# Patient Record
Sex: Female | Born: 1965 | Race: White | Hispanic: No | Marital: Married | Smoking: Never smoker
Health system: Southern US, Community
[De-identification: ages and names within clinical notes are randomized; demographics above are authoritative.]

## PROBLEM LIST (undated history)

## (undated) DIAGNOSIS — T753XXA Motion sickness, initial encounter: Secondary | ICD-10-CM

## (undated) DIAGNOSIS — O09299 Supervision of pregnancy with other poor reproductive or obstetric history, unspecified trimester: Secondary | ICD-10-CM

## (undated) DIAGNOSIS — F419 Anxiety disorder, unspecified: Secondary | ICD-10-CM

## (undated) DIAGNOSIS — E7212 Methylenetetrahydrofolate reductase deficiency: Secondary | ICD-10-CM

## (undated) DIAGNOSIS — I10 Essential (primary) hypertension: Secondary | ICD-10-CM

## (undated) DIAGNOSIS — Z9889 Other specified postprocedural states: Secondary | ICD-10-CM

## (undated) DIAGNOSIS — R112 Nausea with vomiting, unspecified: Secondary | ICD-10-CM

## (undated) DIAGNOSIS — K219 Gastro-esophageal reflux disease without esophagitis: Secondary | ICD-10-CM

## (undated) DIAGNOSIS — Z98891 History of uterine scar from previous surgery: Secondary | ICD-10-CM

## (undated) DIAGNOSIS — Z8489 Family history of other specified conditions: Secondary | ICD-10-CM

## (undated) HISTORY — DX: Supervision of pregnancy with other poor reproductive or obstetric history, unspecified trimester: O09.299

## (undated) HISTORY — PX: DIAGNOSTIC LAPAROSCOPY: SUR761

## (undated) HISTORY — PX: CHOLECYSTECTOMY: SHX55

## (undated) HISTORY — DX: Essential (primary) hypertension: I10

---

## 1998-10-02 HISTORY — PX: KNEE ARTHROSCOPY W/ PARTIAL MEDIAL MENISCECTOMY: SHX1882

## 1998-10-02 HISTORY — PX: KNEE ARTHROSCOPY W/ ACL RECONSTRUCTION AND PATELLA GRAFT: SHX1861

## 2009-10-02 DIAGNOSIS — F419 Anxiety disorder, unspecified: Secondary | ICD-10-CM

## 2009-10-02 HISTORY — DX: Anxiety disorder, unspecified: F41.9

## 2009-10-03 ENCOUNTER — Inpatient Hospital Stay (HOSPITAL_COMMUNITY): Admission: AD | Admit: 2009-10-03 | Discharge: 2009-10-03 | Payer: Self-pay | Admitting: Obstetrics and Gynecology

## 2009-10-03 ENCOUNTER — Ambulatory Visit: Payer: Self-pay | Admitting: Obstetrics and Gynecology

## 2010-01-06 ENCOUNTER — Inpatient Hospital Stay (HOSPITAL_COMMUNITY): Admission: AD | Admit: 2010-01-06 | Discharge: 2010-01-15 | Payer: Self-pay | Admitting: Obstetrics

## 2010-01-11 ENCOUNTER — Encounter (INDEPENDENT_AMBULATORY_CARE_PROVIDER_SITE_OTHER): Payer: Self-pay | Admitting: Obstetrics

## 2010-10-23 ENCOUNTER — Encounter: Payer: Self-pay | Admitting: Obstetrics

## 2010-12-21 LAB — COMPREHENSIVE METABOLIC PANEL
ALT: 42 U/L — ABNORMAL HIGH (ref 0–35)
ALT: 44 U/L — ABNORMAL HIGH (ref 0–35)
ALT: 65 U/L — ABNORMAL HIGH (ref 0–35)
AST: 42 U/L — ABNORMAL HIGH (ref 0–37)
AST: 69 U/L — ABNORMAL HIGH (ref 0–37)
Albumin: 2.7 g/dL — ABNORMAL LOW (ref 3.5–5.2)
Albumin: 2.8 g/dL — ABNORMAL LOW (ref 3.5–5.2)
Albumin: 3 g/dL — ABNORMAL LOW (ref 3.5–5.2)
Alkaline Phosphatase: 51 U/L (ref 39–117)
Alkaline Phosphatase: 54 U/L (ref 39–117)
Alkaline Phosphatase: 54 U/L (ref 39–117)
Alkaline Phosphatase: 59 U/L (ref 39–117)
BUN: 11 mg/dL (ref 6–23)
BUN: 11 mg/dL (ref 6–23)
BUN: 9 mg/dL (ref 6–23)
CO2: 28 mEq/L (ref 19–32)
CO2: 28 mEq/L (ref 19–32)
Calcium: 8 mg/dL — ABNORMAL LOW (ref 8.4–10.5)
Calcium: 8.1 mg/dL — ABNORMAL LOW (ref 8.4–10.5)
Calcium: 8.2 mg/dL — ABNORMAL LOW (ref 8.4–10.5)
Calcium: 9 mg/dL (ref 8.4–10.5)
Creatinine, Ser: 0.63 mg/dL (ref 0.4–1.2)
Creatinine, Ser: 0.68 mg/dL (ref 0.4–1.2)
Creatinine, Ser: 0.71 mg/dL (ref 0.4–1.2)
GFR calc Af Amer: >60 mL/min (ref >60)
GFR calc non Af Amer: >60 mL/min (ref >60)
Glucose, Bld: 109 mg/dL — ABNORMAL HIGH (ref 70–99)
Glucose, Bld: 72 mg/dL (ref 70–99)
Glucose, Bld: 73 mg/dL (ref 70–99)
Glucose, Bld: 89 mg/dL (ref 70–99)
Potassium: 4.4 mEq/L (ref 3.5–5.1)
Potassium: 4.5 mEq/L (ref 3.5–5.1)
Potassium: 4.6 mEq/L (ref 3.5–5.1)
Sodium: 133 mEq/L — ABNORMAL LOW (ref 135–145)
Sodium: 135 mEq/L (ref 135–145)
Sodium: 136 mEq/L (ref 135–145)
Total Bilirubin: 0.2 mg/dL — ABNORMAL LOW (ref 0.3–1.2)
Total Bilirubin: 0.2 mg/dL — ABNORMAL LOW (ref 0.3–1.2)
Total Bilirubin: 0.4 mg/dL (ref 0.3–1.2)
Total Protein: 5.2 g/dL — ABNORMAL LOW (ref 6.0–8.3)
Total Protein: 5.4 g/dL — ABNORMAL LOW (ref 6.0–8.3)
Total Protein: 5.5 g/dL — ABNORMAL LOW (ref 6.0–8.3)
Total Protein: 5.7 g/dL — ABNORMAL LOW (ref 6.0–8.3)
Total Protein: 5.8 g/dL — ABNORMAL LOW (ref 6.0–8.3)

## 2010-12-21 LAB — RPR: RPR Ser Ql: NONREACTIVE

## 2010-12-21 LAB — URIC ACID
Uric Acid, Serum: 4 mg/dL (ref 2.4–7.0)
Uric Acid, Serum: 5 mg/dL (ref 2.4–7.0)
Uric Acid, Serum: 5 mg/dL (ref 2.4–7.0)
Uric Acid, Serum: 5.8 mg/dL (ref 2.4–7.0)

## 2010-12-21 LAB — CREATININE CLEARANCE, URINE, 24 HOUR
Collection Interval-CRCL: 24 hours
Creatinine Clearance: 156 mL/min — ABNORMAL HIGH (ref 75–115)
Creatinine, Urine: 63 mg/dL

## 2010-12-21 LAB — CBC
HCT: 29.6 % — ABNORMAL LOW (ref 36.0–46.0)
HCT: 33.2 % — ABNORMAL LOW (ref 36.0–46.0)
HCT: 33.6 % — ABNORMAL LOW (ref 36.0–46.0)
Hemoglobin: 10.2 g/dL — ABNORMAL LOW (ref 12.0–15.0)
Hemoglobin: 11.4 g/dL — ABNORMAL LOW (ref 12.0–15.0)
Hemoglobin: 11.5 g/dL — ABNORMAL LOW (ref 12.0–15.0)
MCHC: 32.9 g/dL (ref 30.0–36.0)
MCHC: 34.2 g/dL (ref 30.0–36.0)
MCHC: 34.4 g/dL (ref 30.0–36.0)
MCHC: 34.5 g/dL (ref 30.0–36.0)
MCV: 90.6 fL (ref 78.0–100.0)
MCV: 90.7 fL (ref 78.0–100.0)
MCV: 91.5 fL (ref 78.0–100.0)
Platelets: 240 10*3/uL (ref 150–400)
Platelets: 244 10*3/uL (ref 150–400)
RBC: 3.23 MIL/uL — ABNORMAL LOW (ref 3.87–5.11)
RBC: 3.68 MIL/uL — ABNORMAL LOW (ref 3.87–5.11)
RDW: 13.4 % (ref 11.5–15.5)
RDW: 13.6 % (ref 11.5–15.5)
RDW: 13.6 % (ref 11.5–15.5)
WBC: 11.6 10*3/uL — ABNORMAL HIGH (ref 4.0–10.5)

## 2010-12-21 LAB — PROTEIN, URINE, 24 HOUR
Collection Interval-UPROT: 24 hours
Protein, Urine: 77 mg/dL

## 2010-12-21 LAB — LACTATE DEHYDROGENASE: LDH: 128 U/L (ref 94–250)

## 2010-12-22 ENCOUNTER — Ambulatory Visit (HOSPITAL_COMMUNITY)
Admission: RE | Admit: 2010-12-22 | Discharge: 2010-12-22 | Disposition: A | Discharge status: Home / Self Care | Payer: BC Managed Care – PPO | Source: Ambulatory Visit | Attending: Obstetrics | Admitting: Obstetrics

## 2010-12-22 DIAGNOSIS — Z139 Encounter for screening, unspecified: Secondary | ICD-10-CM | POA: Insufficient documentation

## 2011-01-20 NOTE — Op Note (Signed)
Jill Cruz, Jill Cruz           ACCOUNT NO.:  [id number]  MEDICAL RECORD NO.:  [id number]          PATIENT TYPE:  OUT  LOCATION:  MFM                           FACILITY:  [facility]  PHYSICIAN:  Lendon Colonel, MD   DATE OF BIRTH:  1965/11/09  DATE OF PROCEDURE:  01/11/2010 DATE OF DISCHARGE:                              OPERATIVE REPORT   PREOPERATIVE DIAGNOSES:  Severe preeclampsia, fetal distress.  POSTOPERATIVE DIAGNOSES:  Severe preeclampsia, fetal distress.  PROCEDURE:  Classical cesarean section.  SURGEON:  Alphonsus Sias. Ernestina Penna, MD then taken over by Lenoard Aden, MD  ASSISTANT:  Catalina Antigua, MD  ANESTHESIA:  Spinal.  FINDINGS:  A 640-g infant, gender undetermined at the time of dictation. Baby with cry, movement, and heart beat.  Apgars 4 and 7.  Cord pH 7.2. A small placenta with meconium-stained amniotic fluid.  No blood-tinge to the amniotic fluid.  Normal uterus, tubes, and ovaries.  SPECIMENS:  Placenta to Pathology.  ANTIBIOTICS:  Ancef 2 g.  ESTIMATED BLOOD LOSS:  500 mL.  COMPLICATIONS:  None.  INDICATIONS:  This is a 45 year old G1 at 26 weeks and 2 days who had been in the hospital for diagnosis of preeclampsia with oligohydramnios. The patient was receiving continuous monitoring for absence of end- diastolic flow in a baby at the 25th percentile, also known to have a preplacental clot.  The patient was status post betamethasone and 24 hours of magnesium approximately 4 days prior.  Chain of events, review of NST, and review of the events is in a handwritten note in the chart. In short, reactive testing was noted until about 1:40 p.m. today where 4 deep variables occurred over a 20-minute period, each about 1-2 minutes with recovery to baseline and 5-8 beat variability with a strip that was appropriate for gestational age.  Again at 2:30 p.m., there were 4 quick variable decelerations and then the baby was off the monitor for approximately 1-2  minutes.  The RN was in immediately to assess and get the baby back on the monitor.  After about 4-5 minutes of the nurse unable to get the baby on the monitor, I was present with an ultrasound and on ultrasound, there was no fetal heart beat for approximately 4 minutes.  There was no movement, there was no appreciable amniotic fluid, and after about 4 minutes, spontaneous resolution of the heart beat to about 100.  NST was attached.  About 4 minutes of the heart beat in the 100s was noted, and a decision was made for an urgent C-section. The remainder of the chain of events is in the chart.  DESCRIPTION OF PROCEDURE:  After a quick informed consent was obtained, the patient was taken to the operating room where a fetal heart beat in the low 100s was again noted.  A spinal anesthesia was quickly given.  Uterine incision was at 1501.  A low transverse Pfannenstiel skin incision made with a scalpel, carried through to the underlying layer of fascia with a scalpel.  The fascia was nicked in the midline.  The incision was extended laterally with the Mayo scissors.  Inferior aspect of the  fascial incision was grasped with Kocher clamps, elevated up.  The underlying rectus muscles were dissected off with the Mayo scissors. Inferior aspect of the fascial incision was grasped with Kocher clamps, elevated up, and the rectus muscles were dissected off with the Mayo scissors.  The rectus muscles were bluntly divided in the midline.  The peritoneum was identified.  Initial attempts to enter bluntly were unsuccessful.  Pickups x2 were placed on the peritoneal incision, entered sharply with the Mayo scissors.  The incision was extended laterally bluntly and extended inferiorly with the Mayo scissors.  A bladder blade was inserted.  The bladder blade was used to retract the bladder.  Two sponges were placed into the abdomen to retract the omentum that was coming over a very small uterus.  The uterus  was assessed.  A vertical incision was made in the uterus with the scalpel. Given the depth of the incision, Allis clamps were placed by both edges of the uterine incision until the amniotic fluid was entered.  The amniotic sac was ruptured with the knife.  The incision in the amniotic sac was extended.  Meconium-stained fluid was noted.  The infant's occiput was gently grasped and with pressure on the fundus, the infant was delivered.  Cord was clamped and cut.  Cry and movement were noted, and the infant was handed off to the waiting pediatrician.  The placenta was very small and easily fell out of the uterus with minimal traction. No large retroplacental clot was noted.  All placental tissue was removed and sent to the pathologist.  The uterus was exteriorized. Allis clamps was placed along the uterine incision to control hemostasis and a 0 Vicryl was used in a running locked fashion starting at the apex of the incision going down to the bottom of the incision.  At this point, I had to leave the operating room for another operative case.  At this point, Dr. Billy Coast took over the remainder of the case with Dr. Jolayne Panther as the assistant.  A second layer of 0 Monocryl was used to put an imbricating layer over the uterine myometrium.  Good hemostasis was obtained.  The uterus was returned to the abdomen and gutters were cleared of all clots and debris.  The sponges were removed.  The peritoneum was closed with a 2-0 Vicryl in a running fashion and the cut muscle edges and underside of the fascia were inspected, found to be hemostatic, and the fascia was closed with 0 Vicryl in a running fashion.  The subcutaneous tissue was irrigated.  A 2-0 plain gut was used in the subcu space, and the skin was closed with staples.  The patient tolerated the procedure well.  Sponge, lap, and needle counts were correct x3.  The patient was taken to the recovery room in stable condition.     Lendon Colonel, MD     KAF/MEDQ  D:  01/11/2010  T:  01/12/2010  Job:  [id number]  Electronically Signed by Noland Fordyce MD on 01/20/2011 07:01:51 AM

## 2011-01-30 ENCOUNTER — Other Ambulatory Visit: Payer: Self-pay | Admitting: Obstetrics

## 2011-02-20 ENCOUNTER — Other Ambulatory Visit (HOSPITAL_COMMUNITY): Payer: Self-pay | Admitting: Obstetrics

## 2011-02-20 DIAGNOSIS — Z1231 Encounter for screening mammogram for malignant neoplasm of breast: Secondary | ICD-10-CM

## 2011-03-02 ENCOUNTER — Ambulatory Visit (HOSPITAL_COMMUNITY)
Admission: RE | Admit: 2011-03-02 | Discharge: 2011-03-02 | Disposition: A | Discharge status: Home / Self Care | Payer: BC Managed Care – PPO | Source: Ambulatory Visit | Attending: Obstetrics | Admitting: Obstetrics

## 2011-03-02 DIAGNOSIS — Z1231 Encounter for screening mammogram for malignant neoplasm of breast: Secondary | ICD-10-CM | POA: Insufficient documentation

## 2012-02-13 ENCOUNTER — Other Ambulatory Visit (HOSPITAL_COMMUNITY): Payer: Self-pay | Admitting: Obstetrics

## 2012-02-13 DIAGNOSIS — Z1231 Encounter for screening mammogram for malignant neoplasm of breast: Secondary | ICD-10-CM

## 2012-03-11 ENCOUNTER — Ambulatory Visit (HOSPITAL_COMMUNITY): Payer: BC Managed Care – PPO

## 2012-04-08 ENCOUNTER — Ambulatory Visit (HOSPITAL_COMMUNITY)
Admission: RE | Admit: 2012-04-08 | Discharge: 2012-04-08 | Disposition: A | Discharge status: Home / Self Care | Payer: BC Managed Care – PPO | Source: Ambulatory Visit | Attending: Obstetrics | Admitting: Obstetrics

## 2012-04-08 DIAGNOSIS — Z1231 Encounter for screening mammogram for malignant neoplasm of breast: Secondary | ICD-10-CM

## 2012-04-17 ENCOUNTER — Other Ambulatory Visit: Payer: Self-pay

## 2012-04-26 ENCOUNTER — Encounter (HOSPITAL_COMMUNITY): Payer: BC Managed Care – PPO

## 2012-05-01 ENCOUNTER — Encounter (HOSPITAL_COMMUNITY): Payer: Self-pay

## 2012-05-01 ENCOUNTER — Ambulatory Visit (HOSPITAL_COMMUNITY)
Admission: RE | Admit: 2012-05-01 | Discharge: 2012-05-01 | Disposition: A | Discharge status: Home / Self Care | Payer: BC Managed Care – PPO | Source: Ambulatory Visit | Attending: Obstetrics | Admitting: Obstetrics

## 2012-05-01 DIAGNOSIS — D688 Other specified coagulation defects: Secondary | ICD-10-CM

## 2012-05-01 NOTE — Consult Note (Signed)
Maternal Fetal Medicine Consultation  Requesting Provider(s): Noland Fordyce, MD  Reason for consultation: Preconception counseling/ High risk OB  HPI: Jill Cruz is a 46 yo G1P0101 who is seen today for preconception counseling.  She plans to undergo IVF with donor embryos (donor was 46 years old at time of retrieval).  The patient's past OB history is remarkable for a history of severe preeclampsia s/p Classical C-section at 26 weeks for fetal distress.  The patient was admitted for severe preeclampsia and was noted to have absent end-diastolic flow on umbilical artery Doppler studies.  While on continuous fetal monitoring, she developed severe variable decelerations and a period of 4 minutes without fetal heart tones - underwent an urgent cesarean delivery.  Her child had hypospadias that required surgical intervention - with female karyotype,  Her child is currently doing well without any long-term problems. Placental pathology was within normal limits without evidence of clots/ infarcts. 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  She is now seen for recommendations for anticipated pregnancy management and clearance for ART.  OB History: OB History    Grav Para Term Preterm Abortions TAB SAB Ect Mult Living   1 1 0 1 0 0 0 0 0 1       PMH: Infertility, Chronic hypertension - previously on Lisinopril (recently discontinued and started on Labetolol)  PSH: Bilateral salpingotomy 2006, Laparoscopy 2003, 2006, Cholecystectomy (2003), Knee arthroscopy (2000), Wisdom teeth extraction, Classical C-section, IVF (8 cycles over last 5 years)  Meds: Folic acid 4 mg daily, Labetolol, Paxil (currently weaning off)  Allergies: Codeine (nausea/ vomiting)  FH: Hypertension - Maternal grandmother, Maternal grandfather, Breast Cancer - Maternal grandmother, Maternal great aunt, Hodgkin's disease - aunt.    Soc: denies  ETOH/ Tobacco/ Illicit drug use   A/P: 1) MTFHR homozygote - Although hyperhomocysteinemia has was previously reported to be a modest risk factor for thromboembolism, recent data indicates that elevated levels is a weak risk factor.  Intervention studies with B vitamin supplementation showed no reduction in risk for VTE.  Currently, there is insufficient evidence to recommend screening for MTHFR polymorphisms or fasting homocystine levels for venous thromboembolism (ACOG practice bulletin # 124).  Additionally, most large, prospective studies have failed to establish a consistent link between adverse pregnancy outcomes (preeclampsia, fetal loss, abruption) and inherited thrombophilia including hyperhomocystenemia/ MTFHR polymorphisms.  Based on this information, would not recommend Lovinox prophylaxis beyond the first trimester of pregnancy, but feel that continued supplementation with the current dose of folic acid is safe and reasonable.  2) Hx of severe preeeclampsia - Given the patients hx of second trimester severe preeclampsia, she is at high risk for recurrence - risks as high as 25-65% have been reported.  Given this history, would recommend baby ASA supplementation which may be of some benefit in this high risk population.  Would recommend baseline 24-hr urine protein, preeclampsia labs in the first trimester, serial growth scans and antepartum fetal testing no later that [redacted] weeks gestation.  Would recommend repeat C-section at 36-37 weeks if undelivered due to history of prior classical C-section.  3) Hx of previous child with "ambiguous genitalia" / hypospadias - see genetic counselor's note.    We reviewed risks/ likelihood of preterm delivery and recurrence of severe preeclampsia.  The patient is aware of these potential risks and would like to proceed with ART.  Questions were answered to the patient's satisfaction.   Thank you  for the opportunity to be a part of the care of Jill Cruz. Please contact our office if we can be of further assistance.   I spent approximately 30 minutes with this patient with over 50% of time spent in face-to-face counseling.  Alpha Gula, MD

## 2012-05-01 NOTE — Progress Notes (Signed)
Genetic Counseling  High-Risk Gestation Note  Appointment Date:  05/01/2012 Referred By: Lendon Colonel., MD Date of Birth:  12/01/1965  Pregnancy History: G1P0101 Estimated Date of Delivery: Not pregnant Estimated Gestational Age: Not pregnant Attending: Alpha Gula, MD   I met with Mrs. Jill Cruz for preconception genetic counseling regarding her history of preterm delivery of a son with with ambiguous genitalia.    Medical records were reviewed and found to be contributory for Mrs. Jill Cruz delivering at [redacted] weeks gestation by emergency c-section for loss of fetal heart tones.  The baby was noted at birth to be small for gestational age and had ambiguous genitalia.  Prenatal ultrasounds suggested female genitalia; however, at birth the child was noted to have a small phallus with a grade 2 hypospadius.  There was a shawl scrotum.  Chromosome analysis was ordered and revealed a 46,XY karyotype.  There was at least one typed report in Mrs. Jill Cruz's medical records that stated that the child had 66,XX (female) chromosomes, and that the parents had chosen to raise the child as a boy.  This is not the case.  A copy of this child's karyotype was obtained and it did reveal female chromosomes, not female.  Partial androgen insensitivity and 5-alpha-reductase deficiency were ruled out during the child's stay in the NICU.  Mrs. Jill Cruz reported that her son saw endocrinology at [facility] for a short period of time, but was released from care during the first few months of life.  This child is currently doing very well.  He is 46 years old and has had no major health concerns since being released from the NICU.  His only surgery was for genital repair.  He is developmentally normal, even considering his prematurity.  He has never been evaluated by a medical geneticist.  A specific cause for the ambiguous genitalia has not been discovered.    Mrs. Jill Cruz was counseled that there are many  causes for ambiguous genitalia, including environmental (in utero exposure to teratogens), multifactorial, and genetic etiologies.  We reviewed chromosomes, genes, single gene conditions, and recessive, dominant, and X-linked inheritance patterns.  Mrs. Jill Cruz reported that her son was conceived with a donor egg following multiple failed IVF cycles.  She has three frozen embryos from the egg donor she used with her son.  She understands that if her son has a genetic cause for the ambiguous genitalia, the risk of recurrence could be as high as 25%.  We discussed the availability of evaluation by a medical geneticist to determine if her son has a specific genetic condition and to help define recurrence risks.  If a future pregnancy is conceived with a different donor or Mrs. Jill Cruz own egg, the risk of recurrence would be expected to be lower.  However, we reviewed that if Mrs. Jill Cruz conceives with her own egg, there would be risks related to advanced maternal age.  She is aware of those risks and available screening and diagnostic options.  Mrs. Jill Cruz will contact your office or ours if she is interested in having her son evaluated by a medical geneticist.  Alternatively, her son's pediatrician can request an evaluation.  Regarding management of a future pregnancy, Mrs. Jill Cruz was counseled that prenatal screening and diagnostic options may vary, depending on the underlying cause of the ambiguous genitalia.  She understands that detailed ultrasound is available in a future pregnancy, but cannot detect all anatomic differences.   Both family histories were reviewed and found to be  otherwise noncontributory for birth defects, mental retardation, and known genetic conditions. Without further information regarding the provided family history, an accurate genetic risk cannot be calculated. Further genetic counseling is warranted if more information is obtained.  Mrs. Jill Cruz had consultation with Dr. Alpha Gula to review her medical history and associated risks/options.  That consult note will be documented separately.  I counseled Mrs. Jill Cruz regarding the above risks and available options.  The approximate face-to-face time with the genetic counselor was 42 minutes.  Donald Prose, MS Certified Genetic Counselor

## 2012-05-01 NOTE — ED Notes (Signed)
BP-133/86, P-57, Wt-231lb

## 2012-05-07 ENCOUNTER — Encounter (HOSPITAL_COMMUNITY): Payer: BC Managed Care – PPO

## 2012-10-10 LAB — OB RESULTS CONSOLE RPR: RPR: NONREACTIVE

## 2012-10-10 LAB — OB RESULTS CONSOLE HEPATITIS B SURFACE ANTIGEN: Hepatitis B Surface Ag: NEGATIVE

## 2012-10-10 LAB — OB RESULTS CONSOLE ANTIBODY SCREEN: Antibody Screen: NEGATIVE

## 2012-11-06 ENCOUNTER — Encounter: Payer: Self-pay | Admitting: Family Medicine

## 2012-11-06 ENCOUNTER — Ambulatory Visit (INDEPENDENT_AMBULATORY_CARE_PROVIDER_SITE_OTHER): Payer: BC Managed Care – PPO | Admitting: Family Medicine

## 2012-11-06 VITALS — BP 110/80 | HR 76 | Temp 98.0°F | Ht 67.0 in | Wt 248.0 lb

## 2012-11-06 DIAGNOSIS — O09299 Supervision of pregnancy with other poor reproductive or obstetric history, unspecified trimester: Secondary | ICD-10-CM

## 2012-11-06 DIAGNOSIS — I10 Essential (primary) hypertension: Secondary | ICD-10-CM | POA: Insufficient documentation

## 2012-11-06 NOTE — Progress Notes (Signed)
Subjective:    Patient ID: Jill Cruz, female    DOB: [birth year], 47 y.o.   MRN: [id number]  HPI  Very pleasant 47 yo female who is [redacted] weeks pregnant with her second son here to establish care.  Sees Dr. Ernestina Penna and sees her every other week due to her high risk pregnancy due to [facility] and previous high risk pregnancy. Had severe pre eclampsia with first pregnancy- son delivered via emergency C Section at 26 weeks.  On Labetalol 200 mg three times daily.  Having no noticeable side effects and BP has been well controlled.  Morning sickness has resolved.  In good spirits and having no complaints today.  Recently moved from [place] and needs a PCP.  Brings in recent lab work from a work screening- all labs excellent (see scanned report).  Patient Active Problem List  Diagnosis  . Hypertension  . History of pre-eclampsia in prior pregnancy, currently pregnant   Past Medical History  Diagnosis Date  . Hypertension   . History of pre-eclampsia in prior pregnancy, currently pregnant    Past Surgical History  Procedure Date  . Cholecystectomy   . Cesarean section    History  Substance Use Topics  . Smoking status: Never Smoker   . Smokeless tobacco: Not on file  . Alcohol Use: Not on file   Family History  Problem Relation Age of Onset  . Hypertension Mother   . Diabetes Mother   . Arthritis Mother   . Arthritis Father    Allergies  Allergen Reactions  . Codeine    Current Outpatient Prescriptions on File Prior to Visit  Medication Sig Dispense Refill  . labetalol (NORMODYNE) 200 MG tablet Take 200 mg by mouth 3 (three) times daily.       The PMH, PSH, Social History, Family History, Medications, and allergies have been reviewed in [facility], and have been updated if relevant.   Review of Systems See HPI Patient reports no  vision/ hearing changes,anorexia, weight change, fever ,adenopathy, persistant / recurrent hoarseness, swallowing issues, chest pain,  edema,persistant / recurrent cough, hemoptysis, dyspnea(rest, exertional, paroxysmal nocturnal), gastrointestinal  bleeding (melena, rectal bleeding), abdominal pain, excessive heart burn, GU symptoms(dysuria, hematuria, pyuria, voiding/incontinence  Issues) syncope, focal weakness, severe memory loss, concerning skin lesions, depression, anxiety, abnormal bruising/bleeding, major joint swelling, breast masses or abnormal vaginal bleeding.       Objective:   Physical Exam BP 110/80  Pulse 76  Temp 98 F (36.7 C)  Ht 5\' 7"  (1.702 m)  Wt 248 lb (112.492 kg)  BMI 38.84 kg/m2  LMP 02/22/2012  Gen: pleaseant, well-nourished,in no acute distress; alert,appropriate and cooperative throughout examination Head:  normocephalic and atraumatic.   Eyes:  vision grossly intact, pupils equal, pupils round, and pupils reactive to light.   Ears:  R ear normal and L ear normal.   Nose:  no external deformity.   Mouth:  good dentition.   Lungs:  Normal respiratory effort, chest expands symmetrically. Lungs are clear to auscultation, no crackles or wheezes. Heart:  Normal rate and regular rhythm. S1 and S2 normal without gallop, murmur, click, rub or other extra sounds. Abdomen:  Bowel sounds positive,abdomen soft and non-tender without masses, organomegaly or hernias noted. Msk:  No deformity or scoliosis noted of thoracic or lumbar spine.   Extremities:  No clubbing, cyanosis, edema, or deformity noted with normal full range of motion of all joints.   Neurologic:  alert & oriented X3 and gait normal.   Skin:  Intact without suspicious lesions or rashes Psych:  Cognition and judgment appear intact. Alert and cooperative with normal attention span and concentration. No apparent delusions, illusions, hallucinations     Assessment & Plan:   1. Hypertension  Well controlled on Labetolol.  2. History of pre-eclampsia in prior pregnancy, currently pregnant  See above.  Followed by Dr. Ernestina Penna.

## 2012-11-06 NOTE — Patient Instructions (Addendum)
It was wonderful to meet you. Congratulations on your pregnancy!

## 2013-03-04 ENCOUNTER — Other Ambulatory Visit: Payer: Self-pay | Admitting: Obstetrics

## 2013-03-04 DIAGNOSIS — O09819 Supervision of pregnancy resulting from assisted reproductive technology, unspecified trimester: Secondary | ICD-10-CM | POA: Insufficient documentation

## 2013-03-04 DIAGNOSIS — O352XX0 Maternal care for (suspected) hereditary disease in fetus, not applicable or unspecified: Secondary | ICD-10-CM | POA: Insufficient documentation

## 2013-03-17 ENCOUNTER — Encounter (HOSPITAL_COMMUNITY): Payer: Self-pay | Admitting: Pharmacist

## 2013-03-24 ENCOUNTER — Encounter (HOSPITAL_COMMUNITY): Payer: Self-pay

## 2013-03-25 ENCOUNTER — Encounter (HOSPITAL_COMMUNITY)
Admission: RE | Admit: 2013-03-25 | Discharge: 2013-03-25 | Disposition: A | Discharge status: Home / Self Care | Payer: BC Managed Care – PPO | Source: Ambulatory Visit | Attending: Obstetrics | Admitting: Obstetrics

## 2013-03-25 ENCOUNTER — Encounter (HOSPITAL_COMMUNITY): Payer: Self-pay

## 2013-03-25 HISTORY — DX: Other specified postprocedural states: R11.2

## 2013-03-25 HISTORY — DX: Other specified postprocedural states: Z98.890

## 2013-03-25 HISTORY — DX: Anxiety disorder, unspecified: F41.9

## 2013-03-25 HISTORY — DX: Gastro-esophageal reflux disease without esophagitis: K21.9

## 2013-03-25 LAB — CBC
HCT: 32.4 % — ABNORMAL LOW (ref 36.0–46.0)
Hemoglobin: 11.2 g/dL — ABNORMAL LOW (ref 12.0–15.0)
MCV: 81.2 fL (ref 78.0–100.0)
RBC: 3.99 MIL/uL (ref 3.87–5.11)
WBC: 8.2 10*3/uL (ref 4.0–10.5)

## 2013-03-25 LAB — ABO/RH: ABO/RH(D): O POS

## 2013-03-25 LAB — TYPE AND SCREEN
ABO/RH(D): O POS
Antibody Screen: NEGATIVE

## 2013-03-25 NOTE — Patient Instructions (Addendum)
   Your procedure is scheduled on: Thursday 03/27/13 at 9:30 am Enter through the Main Entrance of [facility] at: 8 am  Pick up the phone at the desk and dial [phone] and inform us of your arrival.  Please call this number if you have any problems the morning of surgery: [phone]  Remember: Do not eat any solid foods or drink any liquids after midnight on: Thursday 03/27/13  Please take these medications morning of surgery (with sips of water): Labetalol, and TUMS   Do not wear jewelry, make-up, or FINGER nail polish No metal in your hair or on your body. Do not wear lotions, powders, perfumes. You may wear deodorant.  Please use your CHG wash as directed prior to surgery.  Do not shave anywhere for at least 12 hours prior to first CHG shower.  Do not bring valuables to the hospital. Contacts, Dentures and Partial Plates may not be worn to OR  Leave suitcase in the car. After Surgery it may be brought to your room.  For patients being admitted to the hospital, checkout time is 11:00am the day of discharge.

## 2013-03-26 NOTE — H&P (Signed)
Jill Cruz is a 47 y.o. G2P0101 at [redacted]w[redacted]d presenting for RCS due to h/o prior classical c/s. Pt notes rare contractions. Good fetal movement, No vaginal bleeding, not leaking fluid. No HA, no vision change, no RUQ pain. Pt w/ prior classical c/s at 25 wks and recommendations from MFM for RCS between 36 amd 37 wks.   [facility]are at [facility] since 7 wks - h/o severe PEC at 25 wks as G1 in 2011. Emergency classical c/s after fetal testing for reversed EDF revealed fetal cardiac asystole in presence of provider. Emergency surgery done, baby in NICU for several months - h/o baby with ambigous genitalia, s/p genetic counseling, most likely spontaneous, not genetic in etiology. Nl u/s this preg, nl fetal echo - h/o infertility, endometriosis, x-lap x 2, s/p b/l salpingectomy. Pregnant on 9th round of IVF, donor egg - MTHFR homozygous, used Lovenox until 12 wks, remainder of preg 4 mg folic acid and 81 mg ASA - AMA, unable to do NT screen, nl [facility], donor egg - chronic htn. bp's started elevating at 20 wks, only single dose increase of labetalol to 300mg  tid, several sets of PIH labs normal, never w/ sx of PEC    Prenatal Transfer Tool  Maternal Diabetes: No Genetic Screening: Normal Maternal Ultrasounds/Referrals: Normal Fetal Ultrasounds or other Referrals:  Fetal echo Maternal Substance Abuse:  No Significant Maternal Medications:  None Significant Maternal Lab Results: None     OB History   Grav Para Term Preterm Abortions TAB SAB Ect Mult Living   2 1 0 1 0 0 0 0 0 1      Past Medical History  Diagnosis Date  . History of pre-eclampsia in prior pregnancy, currently pregnant   . PONV (postoperative nausea and vomiting)   . Hypertension     On Labetalol  . Anxiety 2011    Post Partum Anxiety/Depression treated w/ medication. Now resolved.  Marland Kitchen GERD (gastroesophageal reflux disease)     Mild baseline- worse w/pregnancy. Takes TUMS prn.   Past Surgical History  Procedure  Laterality Date  . Cesarean section  2011    at [facility]  . Cholecystectomy  ~2003    [facility] in [place]  . Anterior cruciate ligament repair Right 2000    Reconstruction Surgery at [facility] in [place]  . Diagnostic laparoscopy Left ~2008    Ovarian Cyst removed- [facility]. in [place]  lscope x 2 for gyn reasons  Meds: folic acid, labetalol, PNV, ASA All: codeine sensitivity  Family History: family history includes Arthritis in her father and mother; Diabetes in her mother; and Hypertension in her mother. Social History:  reports that she has never smoked. She does not have any smokeless tobacco history on file. She reports that she does not drink alcohol or use illicit drugs.  Review of Systems - Negative except occasional HA, good FM  Filed Vitals:   03/27/13 0854  BP: 166/99  Pulse:   Temp:   Resp:       Last menstrual period 02/22/2012.  Physical Exam:  Gen: well appearing, no distress  Back: no CVAT Abd: gravid, NT, no RUQ pain LE: trace edema, equal bilaterally, non-tender  Prenatal labs: ABO, Rh: --/--/O POS, O POS (06/24 1015) Antibody: NEG (06/24 1015) Rubella:  immune RPR: NON REACTIVE (06/24 1015)  HBsAg: Negative (01/09 0000)  HIV: Non-reactive (01/09 0000)  GBS:   neg 1 hr Glucola 129  Genetic screening nl [facility] Anatomy US nl u/s, needed fetal echo due to  limited views LVOT- nl fetal echo   Assessment/Plan: 47 y.o. G2P0101 at [redacted]w[redacted]d RCS as planned. Pt aware R/B including those of prematurity at 36 wks.  Monitor for PP depression Resume labetalol PP, watch closely for PP PEC, cont baby ASA.    Stephon Weathers A. 03/26/2013, 11:02 PM

## 2013-03-27 ENCOUNTER — Encounter (HOSPITAL_COMMUNITY): Payer: Self-pay | Admitting: Anesthesiology

## 2013-03-27 ENCOUNTER — Encounter (HOSPITAL_COMMUNITY)
Admission: AD | Disposition: A | Discharge status: Home / Self Care | Payer: Self-pay | Source: Ambulatory Visit | Attending: Obstetrics

## 2013-03-27 ENCOUNTER — Inpatient Hospital Stay (HOSPITAL_COMMUNITY)
Admission: AD | Admit: 2013-03-27 | Discharge: 2013-03-30 | DRG: 370 | Disposition: A | Discharge status: Home / Self Care | Payer: BC Managed Care – PPO | Source: Ambulatory Visit | Attending: Obstetrics | Admitting: Obstetrics

## 2013-03-27 ENCOUNTER — Inpatient Hospital Stay (HOSPITAL_COMMUNITY): Payer: BC Managed Care – PPO | Admitting: Anesthesiology

## 2013-03-27 DIAGNOSIS — Z1589 Genetic susceptibility to other disease: Secondary | ICD-10-CM

## 2013-03-27 DIAGNOSIS — O1002 Pre-existing essential hypertension complicating childbirth: Secondary | ICD-10-CM | POA: Diagnosis present

## 2013-03-27 DIAGNOSIS — O34219 Maternal care for unspecified type scar from previous cesarean delivery: Principal | ICD-10-CM | POA: Diagnosis present

## 2013-03-27 DIAGNOSIS — Z98891 History of uterine scar from previous surgery: Secondary | ICD-10-CM

## 2013-03-27 DIAGNOSIS — O09529 Supervision of elderly multigravida, unspecified trimester: Secondary | ICD-10-CM | POA: Diagnosis present

## 2013-03-27 DIAGNOSIS — I10 Essential (primary) hypertension: Secondary | ICD-10-CM | POA: Diagnosis present

## 2013-03-27 DIAGNOSIS — O9903 Anemia complicating the puerperium: Secondary | ICD-10-CM | POA: Diagnosis not present

## 2013-03-27 DIAGNOSIS — E7212 Methylenetetrahydrofolate reductase deficiency: Secondary | ICD-10-CM | POA: Diagnosis present

## 2013-03-27 DIAGNOSIS — O09299 Supervision of pregnancy with other poor reproductive or obstetric history, unspecified trimester: Secondary | ICD-10-CM

## 2013-03-27 DIAGNOSIS — D62 Acute posthemorrhagic anemia: Secondary | ICD-10-CM | POA: Diagnosis not present

## 2013-03-27 HISTORY — DX: Methylenetetrahydrofolate reductase deficiency: E72.12

## 2013-03-27 HISTORY — DX: Genetic susceptibility to other disease: Z15.89

## 2013-03-27 HISTORY — DX: History of uterine scar from previous surgery: Z98.891

## 2013-03-27 LAB — CORD BLOOD GAS (ARTERIAL)
Acid-base deficit: 1.2 mmol/L (ref 0.0–2.0)
Bicarbonate: 26.1 mEq/L — ABNORMAL HIGH (ref 20.0–24.0)
TCO2: 27.9 mmol/L (ref 0–100)
pCO2 cord blood (arterial): 57.1 mmHg
pH cord blood (arterial): 7.283

## 2013-03-27 SURGERY — Surgical Case
Anesthesia: Spinal | Site: Abdomen | Wound class: Clean Contaminated

## 2013-03-27 MED ORDER — MORPHINE SULFATE (PF) 0.5 MG/ML IJ SOLN
INTRAMUSCULAR | Status: DC | PRN
  Administered 2013-03-27: .15 mg via INTRATHECAL

## 2013-03-27 MED ORDER — LACTATED RINGERS IV SOLN
INTRAVENOUS | Status: DC | PRN
  Administered 2013-03-27 (×3): via INTRAVENOUS

## 2013-03-27 MED ORDER — KETOROLAC TROMETHAMINE 30 MG/ML IJ SOLN
30.0000 mg | Freq: Four times a day (QID) | INTRAMUSCULAR | Status: DC | PRN
  Administered 2013-03-27: 30 mg via INTRAVENOUS

## 2013-03-27 MED ORDER — FOLIC ACID 400 MCG PO TABS
800.0000 ug | ORAL_TABLET | Freq: Every day | ORAL | Status: DC
  Filled 2013-03-27 (×2): qty 2

## 2013-03-27 MED ORDER — IBUPROFEN 600 MG PO TABS
600.0000 mg | ORAL_TABLET | Freq: Four times a day (QID) | ORAL | Status: DC
  Administered 2013-03-27 – 2013-03-30 (×11): 600 mg via ORAL
  Filled 2013-03-27 (×11): qty 1

## 2013-03-27 MED ORDER — OXYTOCIN 10 UNIT/ML IJ SOLN
INTRAMUSCULAR | Status: AC
  Filled 2013-03-27: qty 4

## 2013-03-27 MED ORDER — PHENYLEPHRINE 40 MCG/ML (10ML) SYRINGE FOR IV PUSH (FOR BLOOD PRESSURE SUPPORT)
PREFILLED_SYRINGE | INTRAVENOUS | Status: AC
  Filled 2013-03-27: qty 5

## 2013-03-27 MED ORDER — MEPERIDINE HCL 25 MG/ML IJ SOLN: 6.2500 mg | INTRAMUSCULAR | Status: DC | PRN

## 2013-03-27 MED ORDER — DIPHENHYDRAMINE HCL 50 MG/ML IJ SOLN: 25.0000 mg | INTRAMUSCULAR | Status: DC | PRN

## 2013-03-27 MED ORDER — DIPHENHYDRAMINE HCL 25 MG PO CAPS: 25.0000 mg | ORAL_CAPSULE | Freq: Four times a day (QID) | ORAL | Status: DC | PRN

## 2013-03-27 MED ORDER — ONDANSETRON HCL 4 MG/2ML IJ SOLN
INTRAMUSCULAR | Status: AC
  Filled 2013-03-27: qty 2

## 2013-03-27 MED ORDER — ACETAMINOPHEN 10 MG/ML IV SOLN
1000.0000 mg | Freq: Four times a day (QID) | INTRAVENOUS | Status: AC | PRN
  Filled 2013-03-27: qty 100

## 2013-03-27 MED ORDER — FENTANYL CITRATE 0.05 MG/ML IJ SOLN
INTRAMUSCULAR | Status: AC
  Filled 2013-03-27: qty 2

## 2013-03-27 MED ORDER — NALOXONE HCL 0.4 MG/ML IJ SOLN: 0.4000 mg | INTRAMUSCULAR | Status: DC | PRN

## 2013-03-27 MED ORDER — SENNOSIDES-DOCUSATE SODIUM 8.6-50 MG PO TABS
2.0000 | ORAL_TABLET | Freq: Every day | ORAL | Status: DC
  Administered 2013-03-27 – 2013-03-29 (×3): 2 via ORAL

## 2013-03-27 MED ORDER — MENTHOL 3 MG MT LOZG: 1.0000 | LOZENGE | OROMUCOSAL | Status: DC | PRN

## 2013-03-27 MED ORDER — FENTANYL CITRATE 0.05 MG/ML IJ SOLN
INTRAMUSCULAR | Status: DC | PRN
  Administered 2013-03-27: 25 ug via INTRATHECAL

## 2013-03-27 MED ORDER — ONDANSETRON HCL 4 MG/2ML IJ SOLN: 4.0000 mg | Freq: Three times a day (TID) | INTRAMUSCULAR | Status: DC | PRN

## 2013-03-27 MED ORDER — OXYCODONE-ACETAMINOPHEN 5-325 MG PO TABS: 1.0000 | ORAL_TABLET | ORAL | Status: DC | PRN

## 2013-03-27 MED ORDER — SCOPOLAMINE 1 MG/3DAYS TD PT72
1.0000 | MEDICATED_PATCH | Freq: Once | TRANSDERMAL | Status: DC
  Filled 2013-03-27: qty 1

## 2013-03-27 MED ORDER — DIPHENHYDRAMINE HCL 50 MG/ML IJ SOLN: 12.5000 mg | INTRAMUSCULAR | Status: DC | PRN

## 2013-03-27 MED ORDER — DIBUCAINE 1 % RE OINT: 1.0000 "application " | TOPICAL_OINTMENT | RECTAL | Status: DC | PRN

## 2013-03-27 MED ORDER — MORPHINE SULFATE 0.5 MG/ML IJ SOLN
INTRAMUSCULAR | Status: AC
  Filled 2013-03-27: qty 10

## 2013-03-27 MED ORDER — LANOLIN HYDROUS EX OINT: 1.0000 "application " | TOPICAL_OINTMENT | CUTANEOUS | Status: DC | PRN

## 2013-03-27 MED ORDER — FLUTICASONE PROPIONATE 50 MCG/ACT NA SUSP
2.0000 | Freq: Every day | NASAL | Status: DC
  Administered 2013-03-29: 2 via NASAL
  Filled 2013-03-27: qty 16

## 2013-03-27 MED ORDER — TETANUS-DIPHTH-ACELL PERTUSSIS 5-2.5-18.5 LF-MCG/0.5 IM SUSP: 0.5000 mL | Freq: Once | INTRAMUSCULAR | Status: DC

## 2013-03-27 MED ORDER — ZOLPIDEM TARTRATE 5 MG PO TABS: 5.0000 mg | ORAL_TABLET | Freq: Every evening | ORAL | Status: DC | PRN

## 2013-03-27 MED ORDER — DIPHENHYDRAMINE HCL 25 MG PO CAPS: 25.0000 mg | ORAL_CAPSULE | ORAL | Status: DC | PRN

## 2013-03-27 MED ORDER — ONDANSETRON HCL 4 MG/2ML IJ SOLN: 4.0000 mg | INTRAMUSCULAR | Status: DC | PRN

## 2013-03-27 MED ORDER — SIMETHICONE 80 MG PO CHEW
80.0000 mg | CHEWABLE_TABLET | Freq: Three times a day (TID) | ORAL | Status: DC
  Administered 2013-03-27 – 2013-03-30 (×9): 80 mg via ORAL

## 2013-03-27 MED ORDER — OXYTOCIN 40 UNITS IN LACTATED RINGERS INFUSION - SIMPLE MED: 62.5000 mL/h | INTRAVENOUS | Status: AC

## 2013-03-27 MED ORDER — SIMETHICONE 80 MG PO CHEW
80.0000 mg | CHEWABLE_TABLET | ORAL | Status: DC | PRN
  Administered 2013-03-29: 80 mg via ORAL

## 2013-03-27 MED ORDER — NALOXONE HCL 1 MG/ML IJ SOLN
1.0000 ug/kg/h | INTRAMUSCULAR | Status: DC | PRN
  Filled 2013-03-27: qty 2

## 2013-03-27 MED ORDER — ASPIRIN EC 81 MG PO TBEC
81.0000 mg | DELAYED_RELEASE_TABLET | Freq: Every day | ORAL | Status: DC
  Filled 2013-03-27 (×2): qty 1

## 2013-03-27 MED ORDER — SODIUM CHLORIDE 0.9 % IJ SOLN: 3.0000 mL | INTRAMUSCULAR | Status: DC | PRN

## 2013-03-27 MED ORDER — ONDANSETRON HCL 4 MG/2ML IJ SOLN
INTRAMUSCULAR | Status: DC | PRN
  Administered 2013-03-27 (×2): 4 mg via INTRAVENOUS

## 2013-03-27 MED ORDER — LACTATED RINGERS IV SOLN
INTRAVENOUS | Status: DC | PRN
  Administered 2013-03-27: 10:00:00 via INTRAVENOUS

## 2013-03-27 MED ORDER — METOCLOPRAMIDE HCL 5 MG/ML IJ SOLN: 10.0000 mg | Freq: Three times a day (TID) | INTRAMUSCULAR | Status: DC | PRN

## 2013-03-27 MED ORDER — KETOROLAC TROMETHAMINE 30 MG/ML IJ SOLN: 30.0000 mg | Freq: Four times a day (QID) | INTRAMUSCULAR | Status: DC | PRN

## 2013-03-27 MED ORDER — SCOPOLAMINE 1 MG/3DAYS TD PT72
1.0000 | MEDICATED_PATCH | Freq: Once | TRANSDERMAL | Status: DC
  Administered 2013-03-27: 1.5 mg via TRANSDERMAL

## 2013-03-27 MED ORDER — NALBUPHINE SYRINGE 5 MG/0.5 ML
5.0000 mg | INJECTION | INTRAMUSCULAR | Status: DC | PRN
  Administered 2013-03-27: 10 mg via INTRAVENOUS
  Filled 2013-03-27 (×2): qty 1

## 2013-03-27 MED ORDER — FENTANYL CITRATE 0.05 MG/ML IJ SOLN: 25.0000 ug | INTRAMUSCULAR | Status: DC | PRN

## 2013-03-27 MED ORDER — OXYTOCIN 10 UNIT/ML IJ SOLN
40.0000 [IU] | INTRAVENOUS | Status: DC | PRN
  Administered 2013-03-27: 40 [IU] via INTRAVENOUS

## 2013-03-27 MED ORDER — CEFAZOLIN SODIUM-DEXTROSE 2-3 GM-% IV SOLR
INTRAVENOUS | Status: AC
  Filled 2013-03-27: qty 50

## 2013-03-27 MED ORDER — SCOPOLAMINE 1 MG/3DAYS TD PT72
MEDICATED_PATCH | TRANSDERMAL | Status: AC
  Administered 2013-03-27: 1.5 mg via TRANSDERMAL
  Filled 2013-03-27: qty 1

## 2013-03-27 MED ORDER — ONDANSETRON HCL 4 MG PO TABS: 4.0000 mg | ORAL_TABLET | ORAL | Status: DC | PRN

## 2013-03-27 MED ORDER — CEFAZOLIN SODIUM-DEXTROSE 2-3 GM-% IV SOLR
2.0000 g | INTRAVENOUS | Status: AC
  Administered 2013-03-27: 2 g via INTRAVENOUS

## 2013-03-27 MED ORDER — LACTATED RINGERS IV BOLUS (SEPSIS)
1000.0000 mL | Freq: Once | INTRAVENOUS | Status: AC
  Administered 2013-03-27: 1000 mL via INTRAVENOUS

## 2013-03-27 MED ORDER — PHENYLEPHRINE HCL 10 MG/ML IJ SOLN
INTRAMUSCULAR | Status: DC | PRN
  Administered 2013-03-27 (×4): 80 ug via INTRAVENOUS

## 2013-03-27 MED ORDER — PRENATAL MULTIVITAMIN CH
1.0000 | ORAL_TABLET | Freq: Every day | ORAL | Status: DC
  Administered 2013-03-28 – 2013-03-29 (×2): 1 via ORAL
  Filled 2013-03-27 (×3): qty 1

## 2013-03-27 MED ORDER — NALBUPHINE SYRINGE 5 MG/0.5 ML
5.0000 mg | INJECTION | INTRAMUSCULAR | Status: DC | PRN
  Filled 2013-03-27: qty 1

## 2013-03-27 MED ORDER — LACTATED RINGERS IV SOLN
INTRAVENOUS | Status: DC
  Administered 2013-03-27: 15:00:00 via INTRAVENOUS

## 2013-03-27 MED ORDER — LACTATED RINGERS IV SOLN
Freq: Once | INTRAVENOUS | Status: AC
  Administered 2013-03-27: 08:00:00 via INTRAVENOUS

## 2013-03-27 MED ORDER — LABETALOL HCL 300 MG PO TABS
300.0000 mg | ORAL_TABLET | Freq: Three times a day (TID) | ORAL | Status: DC
  Administered 2013-03-27 – 2013-03-30 (×9): 300 mg via ORAL
  Filled 2013-03-27 (×9): qty 1

## 2013-03-27 MED ORDER — WITCH HAZEL-GLYCERIN EX PADS: 1.0000 "application " | MEDICATED_PAD | CUTANEOUS | Status: DC | PRN

## 2013-03-27 SURGICAL SUPPLY — 27 items
BARRIER ADHS 3X4 INTERCEED (GAUZE/BANDAGES/DRESSINGS) ×2 IMPLANT
CLOTH BEACON ORANGE TIMEOUT ST (SAFETY) ×2 IMPLANT
CONTAINER PREFILL 10% NBF 15ML (MISCELLANEOUS) ×4 IMPLANT
DRAPE LG THREE QUARTER DISP (DRAPES) ×2 IMPLANT
DRSG OPSITE POSTOP 4X10 (GAUZE/BANDAGES/DRESSINGS) ×4 IMPLANT
DURAPREP 26ML APPLICATOR (WOUND CARE) ×2 IMPLANT
ELECT REM PT RETURN 9FT ADLT (ELECTROSURGICAL) ×2
ELECTRODE REM PT RTRN 9FT ADLT (ELECTROSURGICAL) ×1 IMPLANT
EXTRACTOR VACUUM M CUP 4 TUBE (SUCTIONS) ×2 IMPLANT
GLOVE BIOGEL PI IND STRL 7.0 (GLOVE) ×1 IMPLANT
GLOVE BIOGEL PI INDICATOR 7.0 (GLOVE) ×1
GOWN STRL REIN XL XLG (GOWN DISPOSABLE) ×4 IMPLANT
KIT ABG SYR 3ML LUER SLIP (SYRINGE) ×2 IMPLANT
NEEDLE HYPO 25X5/8 SAFETYGLIDE (NEEDLE) ×2 IMPLANT
NS IRRIG 1000ML POUR BTL (IV SOLUTION) ×2 IMPLANT
PACK C SECTION WH (CUSTOM PROCEDURE TRAY) ×2 IMPLANT
PAD OB MATERNITY 4.3X12.25 (PERSONAL CARE ITEMS) ×2 IMPLANT
SUT MON AB 4-0 PS1 27 (SUTURE) ×2 IMPLANT
SUT PLAIN 0 NONE (SUTURE) ×2 IMPLANT
SUT VIC AB 0 CT1 36 (SUTURE) ×4 IMPLANT
SUT VIC AB 0 CTX 36 (SUTURE) ×3
SUT VIC AB 0 CTX36XBRD ANBCTRL (SUTURE) ×3 IMPLANT
SUT VIC AB 2-0 CT1 27 (SUTURE) ×2
SUT VIC AB 2-0 CT1 TAPERPNT 27 (SUTURE) ×2 IMPLANT
TOWEL OR 17X24 6PK STRL BLUE (TOWEL DISPOSABLE) ×6 IMPLANT
TRAY FOLEY CATH 14FR (SET/KITS/TRAYS/PACK) ×2 IMPLANT
WATER STERILE IRR 1000ML POUR (IV SOLUTION) ×2 IMPLANT

## 2013-03-27 NOTE — Anesthesia Postprocedure Evaluation (Signed)
  Anesthesia Post-op Note  Patient: Jill Cruz  Procedure(s) Performed: Procedure(s) with comments: REPEAT CESAREAN SECTION (N/A) - Repeat C/S  EDD: 04/23/13  Patient Location: Mother/Baby  Anesthesia Type:Spinal  Level of Consciousness: awake, alert  and oriented  Airway and Oxygen Therapy: Patient Spontanous Breathing  Post-op Pain: none  Post-op Assessment: Post-op Vital signs reviewed  Post-op Vital Signs: Reviewed and stable  Complications: No apparent anesthesia complications

## 2013-03-27 NOTE — Transfer of Care (Signed)
Immediate Anesthesia Transfer of Care Note  Patient: Jill Cruz  Procedure(s) Performed: Procedure(s) with comments: REPEAT CESAREAN SECTION (N/A) - Repeat C/S  EDD: 04/23/13  Patient Location: PACU  Anesthesia Type:Spinal  Level of Consciousness: awake, alert  and oriented  Airway & Oxygen Therapy: Patient Spontanous Breathing  Post-op Assessment: Report given to PACU RN and Post -op Vital signs reviewed and stable  Post vital signs: Reviewed and stable  Complications: No apparent anesthesia complications

## 2013-03-27 NOTE — Anesthesia Procedure Notes (Signed)

## 2013-03-27 NOTE — Anesthesia Preprocedure Evaluation (Signed)
Anesthesia Evaluation  Patient identified by MRN, date of birth, ID band Patient awake    Reviewed: Allergy & Precautions, H&P , NPO status , Patient's Chart, lab work & pertinent test results, reviewed documented beta blocker date and time   History of Anesthesia Complications (+) PONV  Airway Mallampati: III TM Distance: >3 FB Neck ROM: Full    Dental no notable dental hx. (+) Teeth Intact   Pulmonary neg pulmonary ROS,  breath sounds clear to auscultation  Pulmonary exam normal       Cardiovascular hypertension, On Medications and On Home Beta Blockers negative cardio ROS  Rhythm:Regular Rate:Normal     Neuro/Psych negative neurological ROS  negative psych ROS   GI/Hepatic Neg liver ROS, GERD-  Medicated and Controlled,  Endo/Other  Morbid obesity  Renal/GU negative Renal ROS  negative genitourinary   Musculoskeletal   Abdominal Normal abdominal exam  (+)   Peds  Hematology negative hematology ROS (+)   Anesthesia Other Findings   Reproductive/Obstetrics (+) Pregnancy AMA Previous Classical C/Section                           Anesthesia Physical Anesthesia Plan  ASA: III  Anesthesia Plan: Spinal   Post-op Pain Management:    Induction:   Airway Management Planned:   Additional Equipment:   Intra-op Plan:   Post-operative Plan:   Informed Consent: I have reviewed the patients History and Physical, chart, labs and discussed the procedure including the risks, benefits and alternatives for the proposed anesthesia with the patient or authorized representative who has indicated his/her understanding and acceptance.     Plan Discussed with: Anesthesiologist  Anesthesia Plan Comments:         Anesthesia Quick Evaluation

## 2013-03-27 NOTE — Op Note (Signed)
03/27/2013  11:01 AM  PATIENT:  Jill Cruz  47 y.o. female  PRE-OPERATIVE DIAGNOSIS:  History of previous classical cesarean section, Chronic hypertension, Advanced maternal age  4  POST-OPERATIVE DIAGNOSIS: same as above  PROCEDURE:  Procedure(s) with comments: REPEAT CESAREAN SECTION (N/A) - Repeat C/S  EDD: 04/23/13  SURGEON:  Surgeon(s) and Role:    * Jill Fries, MD - Assisting    Tresa Endo A. Ernestina Penna, MD - Primary  PHYSICIAN ASSISTANT:   ASSISTANTS: Mody   ANESTHESIA:   spinal  EBL:  Total I/O In: 2000 [I.V.:2000] Out: 950 [Urine:150; Blood:800]  BLOOD ADMINISTERED:none  DRAINS: Urinary Catheter (Foley)   LOCAL MEDICATIONS USED:  NONE  SPECIMEN:  Source of Specimen:  placenta  DISPOSITION OF SPECIMEN:  L&D  COUNTS:  YES  TOURNIQUET:  * No tourniquets in log *  DICTATION: epic  PLAN OF CARE: Admit to inpatient   PATIENT DISPOSITION:  PACU - hemodynamically stable.   Delay start of Pharmacological VTE agent (>24hrs) due to surgical blood loss or risk of bleeding: yes   Findings:  @BABYSEXEBC @ infant,  APGAR (1 MIN): 8   APGAR (5 MINS): 9   APGAR (10 MINS):    DOA, clear amniotic fluid, non-visualization of ovaries, omental adhesions to anterior abdominal wall EBL: per anasthesia cc Antibiotics:  2g Ancef  Complications: none  Indications: This is a 47 y.o. year-old, G2P0101  At [redacted]w[redacted]d admitted for RCS at 36 wks per MFM reccs due to prior classical c/s. Risks benefits and alternatives of the procedure were discussed with the patient who agreed to proceed  Procedure:  After informed consent was obtained the patient was taken to the operating room where spinal anesthesia was initiated.  She was prepped and draped in the normal sterile fashion in dorsal supine position with a leftward tilt.  A foley catheter was inserted sterilely into the bladder  Gloves were changes and attention was turned to the patient's abdomen. A Pfannenstiel skin  incision was made 2 cm above the pubic symphysis in the midline with the scalpel over the prior Pfanensteil scar.  Dissection was carried down with the Bovie cautery until the fascia was reached.   The fascia was incised in the midline. The incision was extended laterally with the Mayo scissors. A large defect in the peritoneum was noted, containing omentum just to the left of the midline. The inferior aspect of the fascial incision was grasped with the Coker clamps, elevated up and the underlying rectus muscles were dissected off sharply. The superior aspect of the fascial incision was grasped with the Coker clamps elevated up and the underlying rectus muscles were dissected off sharply.  The peritoneum was further evaluated at the level of the defect. Omentum was taken off with bovie cautery, hemostasis was noted. Further dissection of the peritoneum and fascia was done with careful inspection. The bladder blade was inserted, the vesicouterine peritoneum was identified grasped with the pickups and entered sharply. The bladder flap was created digitally the bladder blade was reinserted. Palpation was done to assess the fetal position and the location of the uterine vessels. The lower segment of the uterus was incised sharply with the scalpel and extended superiorly and laterally with the bandage scissors.  Allis clamps were u sed to help in the incision due to the thickness of the lower segment. The infant also was grasped brought to the incision,  rotated and the infant was delivered with fundal pressure and the help of a vacuum.  The nose and mouth were bulb suctioned. The cord was clamped and cut. The infant was handed off to the waiting pediatrician. The placenta was expressed. The uterus was left in situ.. The uterus was cleared of all clots and debris. The uterine incision was repaired with 0 Vicryl in a running locked fashion.  A second layer of the same suture was used in an imbricating fashion to obtain  excellent hemostasis. Several additional figure of 8 sutures were used to assume hemostasis. The peritoneum was inspected but there was not enough freed peritoneum to close. The muscles were reapproximated in the midline with a single figure of 8 of 2-0 vicryl. The cut muscle edges and the underside of the fascia were inspected and found to be hemostatic. The fascia was closed with 0 Vicryl in two halves. . Scarpa's layer was closed with a 2-0 plain gut suture. The skin was closed with a 4-0 Monocryl in a single layer. The patient tolerated the procedure well. Sponge lap and needle counts were correct x3 and patient was taken to the recovery room in a stable condition.  Jill Cruz A. 03/27/2013 11:03 AM

## 2013-03-27 NOTE — Anesthesia Postprocedure Evaluation (Signed)
  Anesthesia Post-op Note  Patient: Jill Cruz  Procedure(s) Performed: Procedure(s) with comments: REPEAT CESAREAN SECTION (N/A) - Repeat C/S  EDD: 04/23/13  Patient Location: PACU  Anesthesia Type:Spinal  Level of Consciousness: awake, alert  and oriented  Airway and Oxygen Therapy: Patient Spontanous Breathing  Post-op Pain: none  Post-op Assessment: Post-op Vital signs reviewed, Patient's Cardiovascular Status Stable, Respiratory Function Stable, Patent Airway, No signs of Nausea or vomiting, Pain level controlled, No headache and No backache  Post-op Vital Signs: Reviewed and stable  Complications: No apparent anesthesia complications

## 2013-03-27 NOTE — Brief Op Note (Signed)
03/27/2013  11:01 AM  PATIENT:  Jill Cruz  47 y.o. female  PRE-OPERATIVE DIAGNOSIS:  History of previous classical cesarean section, Chronic hypertension, Advanced maternal age  53  POST-OPERATIVE DIAGNOSIS: same as above  PROCEDURE:  Procedure(s) with comments: REPEAT CESAREAN SECTION (N/A) - Repeat C/S  EDD: 04/23/13  SURGEON:  Surgeon(s) and Role:    * Robley Fries, MD - Assisting    Tresa Endo A. Ernestina Penna, MD - Primary  PHYSICIAN ASSISTANT:   ASSISTANTS: Mody   ANESTHESIA:   spinal  EBL:  Total I/O In: 2000 [I.V.:2000] Out: 950 [Urine:150; Blood:800]  BLOOD ADMINISTERED:none  DRAINS: Urinary Catheter (Foley)   LOCAL MEDICATIONS USED:  NONE  SPECIMEN:  Source of Specimen:  placenta  DISPOSITION OF SPECIMEN:  L&D  COUNTS:  YES  TOURNIQUET:  * No tourniquets in log *  DICTATION: .Dragon Dictation  PLAN OF CARE: Admit to inpatient   PATIENT DISPOSITION:  PACU - hemodynamically stable.   Delay start of Pharmacological VTE agent (>24hrs) due to surgical blood loss or risk of bleeding: yes

## 2013-03-27 NOTE — Consult Note (Signed)
Neonatology Note:   Attendance at C-section:    I was asked by Dr. Ernestina Penna to attend this repeat C/S at 36 1/7 weeks due to previous classical incision. The mother is a G2P1 O pos, GBS neg with infertility and chronic HTN, on Labetalol. 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 A previous infant had ambiguous genitalia, not felt to be genetic, and fetal ultrasound on this infant has been normal. ROM at delivery, fluid clear. Infant vigorous with good spontaneous cry and tone. Needed only minimal bulb suctioning. Ap 8/9. Lungs clear to ausc in DR. To CN to care of Pediatrician.   Doretha Sou, MD

## 2013-03-28 ENCOUNTER — Encounter (HOSPITAL_COMMUNITY): Payer: Self-pay | Admitting: Obstetrics

## 2013-03-28 LAB — CBC
MCHC: 33.6 g/dL (ref 30.0–36.0)
MCV: 83 fL (ref 78.0–100.0)
Platelets: 279 10*3/uL (ref 150–400)
RDW: 14.3 % (ref 11.5–15.5)
WBC: 12.7 10*3/uL — ABNORMAL HIGH (ref 4.0–10.5)

## 2013-03-28 LAB — COMPREHENSIVE METABOLIC PANEL
AST: 71 U/L — ABNORMAL HIGH (ref 0–37)
Albumin: 2.1 g/dL — ABNORMAL LOW (ref 3.5–5.2)
Chloride: 98 mEq/L (ref 96–112)
Creatinine, Ser: 0.85 mg/dL (ref 0.50–1.10)
Potassium: 4.5 mEq/L (ref 3.5–5.1)
Total Bilirubin: 0.4 mg/dL (ref 0.3–1.2)
Total Protein: 5.2 g/dL — ABNORMAL LOW (ref 6.0–8.3)

## 2013-03-28 NOTE — Lactation Note (Signed)
This note was copied from the chart of Jill Teria Khachatryan. Lactation Consultation Note Baby is now under bili blankets x2 in the bassinet. Mom states he has not feed in a few hours; offered to assist with a feeding, mom accepts.  Mom is concerned about breast feeding baby while he is using the bili blankets, states it is difficult to position him. Enc mom to always call for assistance with latch if she is having difficulty, especially while baby is using bili blankets.  Assisted mom to position baby in football on left. Assisted mom with hand expression, mom was able to hand express drops of colostrum. Baby latched well, with occasional audible swallows. Baby falls asleep often, mom has to stimulate to keep him awake. Discussed DEBP and alternate feeding methods with mom. Mom agrees to start pumping - to provide a supplement if needed, and to stimulate milk production. DEBP set up for mom and demonstrated use, mom began pumping after feeding baby. Mom is experienced with breast pump, as her first child was born at 51 weeks and she pumped for him for 2 months. Inst mom to hand express after pumping. Inst mom how to feed the expressed breast milk to baby via spoon or curved tip syringe.  Written instructions provided. Questions answered. Mom states understanding and agreement with feeding plan.  Enc mom to call for help any time she has a concern.   Patient Name: Jill Cruz FAOZH'Y Date: 03/28/2013 Reason for consult: Follow-up assessment;Hyperbilirubinemia;Late preterm infant   Maternal Data Has patient been taught Hand Expression?: Yes  Feeding Feeding Type: Breast Milk Feeding method: Breast Length of feed: 30 min  LATCH Score/Interventions Latch: Grasps breast easily, tongue down, lips flanged, rhythmical sucking.  Audible Swallowing: A few with stimulation  Type of Nipple: Everted at rest and after stimulation  Comfort (Breast/Nipple): Soft / non-tender     Hold  (Positioning): Assistance needed to correctly position infant at breast and maintain latch. Intervention(s): Breastfeeding basics reviewed;Support Pillows;Position options  LATCH Score: 8  Lactation Tools Discussed/Used Pump Review: Setup, frequency, and cleaning;Milk Storage   Consult Status Consult Status: Follow-up Follow-up type: In-patient    Octavio Manns [facility] 03/28/2013, 3:13 PM

## 2013-03-28 NOTE — Progress Notes (Signed)
Patient ID: Jill Cruz, female   DOB: 11-04-1965, 47 y.o.   MRN: [id number] POD # 1  Subjective: Pt reports feeling tired, but "ok"/ Pain controlled with motrin.  has not taken percocet yet; has fear of nausea with pain med use. Tolerating po/ Foley d/c'ed and voiding without problems/ No n/v/Flatus neg Activity: out of bed and ambulate Bleeding is light Newborn info:  Information for the patient's newborn:  Wannetta, Langland [[id number]]  female  / circ desired/ Feeding: breast   Objective: VS: Blood pressure 139/82, pulse 70, temperature 97.5 F (36.4 C), temperature source Oral, resp. rate 20.    Intake/Output Summary (Last 24 hours) at 03/28/13 0939 Last data filed at 03/28/13 0500  Gross per 24 hour  Intake 5472.92 ml  Output   2110 ml  Net 3362.92 ml      Recent Labs  03/25/13 1015 03/28/13 0600  WBC 8.2 12.7*  HGB 11.2* 8.7*  HCT 32.4* 25.9*  PLT 353 279    Blood type: O POS Rubella: Immune    Physical Exam:  General: alert, cooperative and no distress CV: Regular rate and rhythm Resp: clear Abdomen: soft, nontender, normal bowel sounds Incision: Covered with Tegaderm and honeycomb dressing; well approximated. Uterine Fundus: firm, below umbilicus, nontender Lochia: minimal Ext: edema +2 and Homans sign is negative, no sign of DVT    A/P: POD # 1/ G2P0202 S/P C/Section d/t planned repeat Chronic anemia, worsened by ABL anemia; will start iron tomorrow Chart reviewed from past c/s, and pt tolerated percocet for pain without problems; will urge to improve pain control with percocet Doing well Continue routine post op orders   Signed: Demetrius Revel, MSN, [facility] 03/28/2013, 9:39 AM

## 2013-03-29 LAB — CBC
HCT: 23.3 % — ABNORMAL LOW (ref 36.0–46.0)
Hemoglobin: 7.9 g/dL — ABNORMAL LOW (ref 12.0–15.0)
MCH: 28.2 pg (ref 26.0–34.0)
MCHC: 33.9 g/dL (ref 30.0–36.0)
MCV: 83.2 fL (ref 78.0–100.0)
Platelets: 270 10*3/uL (ref 150–400)
RBC: 2.8 MIL/uL — ABNORMAL LOW (ref 3.87–5.11)
RDW: 14 % (ref 11.5–15.5)
WBC: 12 10*3/uL — ABNORMAL HIGH (ref 4.0–10.5)

## 2013-03-29 LAB — COMPREHENSIVE METABOLIC PANEL
ALT: 68 U/L — ABNORMAL HIGH (ref 0–35)
AST: 48 U/L — ABNORMAL HIGH (ref 0–37)
Albumin: 2 g/dL — ABNORMAL LOW (ref 3.5–5.2)
Alkaline Phosphatase: 82 U/L (ref 39–117)
BUN: 8 mg/dL (ref 6–23)
CO2: 27 mEq/L (ref 19–32)
Calcium: 8.8 mg/dL (ref 8.4–10.5)
Chloride: 104 mEq/L (ref 96–112)
Creatinine, Ser: 0.69 mg/dL (ref 0.50–1.10)
GFR calc Af Amer: >90 mL/min (ref >90)
GFR calc non Af Amer: >90 mL/min (ref >90)
Glucose, Bld: 90 mg/dL (ref 70–99)
Potassium: 4.3 mEq/L (ref 3.5–5.1)
Sodium: 137 mEq/L (ref 135–145)
Total Bilirubin: 0.3 mg/dL (ref 0.3–1.2)
Total Protein: 4.7 g/dL — ABNORMAL LOW (ref 6.0–8.3)

## 2013-03-29 LAB — URIC ACID: Uric Acid, Serum: 4.6 mg/dL (ref 2.4–7.0)

## 2013-03-29 MED ORDER — POLYSACCHARIDE IRON COMPLEX 150 MG PO CAPS
150.0000 mg | ORAL_CAPSULE | Freq: Two times a day (BID) | ORAL | Status: DC
  Administered 2013-03-29 – 2013-03-30 (×2): 150 mg via ORAL
  Filled 2013-03-29 (×2): qty 1

## 2013-03-29 NOTE — Progress Notes (Signed)
Patient ID: Jill Cruz, female   DOB: [birth year], 47 y.o.   MRN: [id number] POD # 2 G2 P0 2 0 2 s/p repeat c/s @ 36wks, hx HTN and PEC in previous preg  Subjective: Pt reports feeling well, "better".  No HA, SOB, CP or dizziness.  Mild blurred vision, but no scotoma Pain controlled with ibuprofen and percocet Tolerating po/Voiding without problems/ No n/v/Flatus pos Activity: out of bed and ambulate Bleeding is light Newborn info:  Information for the patient's newborn:  Jill, Cruz [[id number]]  female circ today, per Dr Ernestina Penna Feeding: breast   Objective: VS: Blood pressure 143/80, pulse 96, temperature 98 F (36.7 C), temperature source Oral, resp. rate 18.  LABS: WBC: 12.0 Hgb: 7.9 Hct: 23.3 Plt: 270 Uric Acid: pending AST/ALT: 48/68   Physical Exam:  General: alert, cooperative and no distress CV: Regular rate and rhythm Resp: clear Abdomen: soft, nontender, normal bowel sounds Uterine Fundus: firm, below umbilicus, nontender Incision:  Covered with Tegaderm and honeycomb dressing; well approximated. Lochia: minimal Ext: edema +1 to +2 and Homans sign is negative, no sign of DVT    A/P: POD # 2/ G2P0202/ S/P Repeat C/Section at 36 wks d/t hx emergent previous classical C/S,  HTN, hx severe PEC in previous preg HTN stable on meds.  Labs improving.  Uric acid pending ABL and chronic anemia; start supplement today Doing well Continue routine post op orders Anticipate discharge home in the am   Signed: Demetrius Revel, MSN, [facility] 03/29/2013, 10:04 AM

## 2013-03-29 NOTE — Lactation Note (Signed)
This note was copied from the chart of Jill Cruz. Mom reports that baby has been nursing well until he was circ'd this morning. Has been sleepy since. Reports that she pumped twice earlier this morning- obtaining small amounts and giving it to him by syringe. Has also been giving bottles of formula. Baby asleep in grandmother's arms. No questions at present To call prn

## 2013-03-30 ENCOUNTER — Encounter (HOSPITAL_COMMUNITY): Payer: Self-pay | Admitting: Obstetrics and Gynecology

## 2013-03-30 DIAGNOSIS — Z98891 History of uterine scar from previous surgery: Secondary | ICD-10-CM

## 2013-03-30 HISTORY — DX: History of uterine scar from previous surgery: Z98.891

## 2013-03-30 MED ORDER — DOCUSATE SODIUM 100 MG PO CAPS: 100.0000 mg | ORAL_CAPSULE | Freq: Two times a day (BID) | ORAL | Status: DC

## 2013-03-30 MED ORDER — OXYCODONE-ACETAMINOPHEN 5-325 MG PO TABS: 1.0000 | ORAL_TABLET | ORAL | Status: DC | PRN

## 2013-03-30 MED ORDER — POLYSACCHARIDE IRON COMPLEX 150 MG PO CAPS: 150.0000 mg | ORAL_CAPSULE | Freq: Two times a day (BID) | ORAL | Status: DC

## 2013-03-30 MED ORDER — IBUPROFEN 600 MG PO TABS: 600.0000 mg | ORAL_TABLET | Freq: Four times a day (QID) | ORAL | Status: DC | PRN

## 2013-03-30 NOTE — Discharge Summary (Signed)
Obstetric Discharge Summary Reason for Admission: G2 P0 1 0 1 for repeat c/s at 36wks d/t hx emergent c/s at 26wk and hx severe PEC; Hx HTN, Hx MTHFR pos Prenatal Procedures: NST, ultrasound and PIH labs Intrapartum Procedures: cesarean: low cervical, transverse Postpartum Procedures: none Complications-Operative and Postpartum: none Hemoglobin  Date Value Range Status  03/29/2013 7.9* 12.0 - 15.0 g/dL Final     HCT  Date Value Range Status  03/29/2013 23.3* 36.0 - 46.0 % Final    Physical Exam:  General: alert, cooperative and no distress Lochia: appropriate Uterine Fundus: firm Incision: Covered with Tegaderm and honeycomb dressing; well approximated. DVT Evaluation: No evidence of DVT seen on physical exam. Calf/Ankle edema is present, +3 pedal and pretib edema  Discharge Diagnoses: S/P repeat c/s at 36 wks; hx HTN, severe PEC in previous pregnancy, Pos MTHFR, sig pp fluid retention  PEC reportable symptoms reviewed.  Pt to check BP's at home BID and report any readings over 150/90  Discharge Information: Date: 03/30/2013 Activity: pelvic rest Diet: routine Medications: PNV, Ibuprofen, Colace, Iron and Percocet Condition: stable Instructions: refer to practice specific booklet Discharge to: home Follow-up Information   Follow up with [name], MD In 6 weeks. (BP recheck and removal of dressing in the office on POD 5 or 6)    Contact information:   [name] [place] [address] [phone]       Newborn Data: Live born female on 03/27/13 Birth Weight: 5 lb 9.4 oz (2535 g) APGAR: 8, 9  Home with mother.  Jill Cruz K 03/30/2013, 10:52 AM

## 2013-03-30 NOTE — Progress Notes (Signed)
Patient ID: Jill Cruz, female   DOB: [birth year], 47 y.o.   MRN: [id number] POD # 3  Subjective: Pt reports feeling well and eager for d/c home.  Denies epigastric pain, HA, scotoma, CP, SOB Pain controlled with ibuprofen and percocet Tolerating po/Voiding without problems/ No n/v/Flatus pos Activity: out of bed and ambulate Bleeding is light Newborn info:  Information for the patient's newborn:  Lynore, Coscia [[id number]]  female  / circ completed per Dr Ernestina Penna Feeding: breast   Objective: VS: Blood pressure 141/86, pulse 80, temperature 98.7 F (37.1 C), temperature source Oral, resp. rate 18.   LABS:  Recent Labs  03/28/13 0600 03/29/13 0610  WBC 12.7* 12.0*  HGB 8.7* 7.9*  HCT 25.9* 23.3*  PLT 279 270     Physical Exam:  General: alert, cooperative and no distress CV: Regular rate and rhythm Resp: clear Abdomen: soft, nontender, normal bowel sounds Incision: healing well, well approximated, Covered with Tegaderm and honeycomb dressing Uterine Fundus: firm, below umbilicus, nontender Lochia: minimal Ext: edema +2 to +3 and Homans sign is negative, no sign of DVT    A/P: POD # 3/ G2P0202/ S/P C/Section d/t planned repeat at 36wks Hx HTN; stable, but with marked fluid retention/ PEC labs WNL yesterday. ABL and chronic anemia; fe supplement BID Hx MTHFR; cont folic acid Will plan BP recheck in office POD 5 or 6 Will remove dressing in the office Doing well and stable for discharge home RX's: Ibuprofen 600mg  po Q 6 hrs prn pain #30 Refill x 1 Percocet 5/325 1 - 2 tabs po every 6 hrs prn pain  #30 No refill Niferex 150mg  po BID #60 Refill x 1 RT pp visit in 6 weeks    Signed: Demetrius Revel, MSN, [facility] 03/30/2013, 10:46 AM

## 2013-04-14 ENCOUNTER — Other Ambulatory Visit (HOSPITAL_COMMUNITY): Payer: Self-pay | Admitting: Obstetrics

## 2013-05-16 ENCOUNTER — Encounter: Payer: Self-pay | Admitting: Family Medicine

## 2013-05-16 ENCOUNTER — Ambulatory Visit (INDEPENDENT_AMBULATORY_CARE_PROVIDER_SITE_OTHER): Payer: BC Managed Care – PPO | Admitting: Family Medicine

## 2013-05-16 VITALS — BP 112/84 | HR 65 | Temp 98.0°F | Wt 242.2 lb

## 2013-05-16 DIAGNOSIS — I10 Essential (primary) hypertension: Secondary | ICD-10-CM | POA: Insufficient documentation

## 2013-05-16 MED ORDER — LISINOPRIL 20 MG PO TABS: 20.0000 mg | ORAL_TABLET | Freq: Every day | ORAL | Status: DC

## 2013-05-16 NOTE — Patient Instructions (Addendum)
Good to see you. Please stop taking the Labetolol.  We are starting Lisinopril 20 mg daily. Please call me in 1-2 weeks with your blood pressure readings.

## 2013-05-16 NOTE — Progress Notes (Signed)
Subjective:    Patient ID: Jill Cruz, female    DOB: January 13, 1966, 47 y.o.   MRN: [id number]  HPI  Very pleasant 47 yo female whom I have seen only once previously here for "blood pressure issues."  Delivered her second son on 03/27/2013 (at 36 weeks). Had severe pre eclampsia with first pregnancy- son delivered via emergency C Section at 26 weeks.  Was on labetalol 200 mg three times daily throughout her pregnancy.  OB asked her to follow up with me to restart her lisinopril.  Was on lisinopril prior to her last pregnancy without issue.  Labetolol does make her tired.  She has BP cuff at home.    Denies any dizziness, SOB, or CP.    Patient Active Problem List   Diagnosis Date Noted  . HTN (hypertension) 05/16/2013  . S/P repeat low transverse C-section 03/30/2013   Past Medical History  Diagnosis Date  . History of pre-eclampsia in prior pregnancy, currently pregnant   . PONV (postoperative nausea and vomiting)   . Hypertension     On Labetalol  . Anxiety 2011    Post Partum Anxiety/Depression treated w/ medication. Now resolved.  Marland Kitchen GERD (gastroesophageal reflux disease)     Mild baseline- worse w/pregnancy. Takes TUMS prn.  . Postpartum care following cesarean delivery (03/27/13) 03/27/2013  . MTHFR mutation 03/27/2013  . S/P repeat low transverse C-section 03/30/2013   Past Surgical History  Procedure Laterality Date  . Cesarean section  2011    at [facility]  . Cholecystectomy  ~2003    [facility] in [place]  . Anterior cruciate ligament repair Right 2000    Re[facility] Surgery at [facility]. in [place]  . Diagnostic laparoscopy Left ~2008    Ovarian Cyst removed- [facility] in [place]  . Cesarean section N/A 03/27/2013    Procedure: REPEAT CESAREAN SECTION;  Surgeon: Tresa Endo A. Ernestina Penna, MD;  Location: [facility];  Service: Obstetrics;  Laterality: N/A;  Repeat C/S  EDD: 04/23/13   History  Substance Use Topics  . Smoking status: Never Smoker   . Smokeless tobacco: Not  on file  . Alcohol Use: No   Family History  Problem Relation Age of Onset  . Hypertension Mother   . Diabetes Mother   . Arthritis Mother   . Arthritis Father    Allergies  Allergen Reactions  . Codeine Nausea And Vomiting   Current Outpatient Prescriptions on File Prior to Visit  Medication Sig Dispense Refill  . aspirin EC 81 MG tablet Take 81 mg by mouth daily.      . calcium carbonate (TUMS - DOSED IN MG ELEMENTAL CALCIUM) 500 MG chewable tablet Chew 1 tablet by mouth every 4 (four) hours as needed for heartburn.      . docusate sodium (COLACE) 100 MG capsule Take 1 capsule (100 mg total) by mouth 2 (two) times daily.  60 capsule  2  . folic acid (FOLVITE) 400 MCG tablet Take 400 mcg by mouth daily.      Marland Kitchen ibuprofen (ADVIL,MOTRIN) 600 MG tablet Take 1 tablet (600 mg total) by mouth every 6 (six) hours as needed for pain.  30 tablet  1  . iron polysaccharides (NIFEREX) 150 MG capsule Take 1 capsule (150 mg total) by mouth 2 (two) times daily with a meal.  60 capsule  2  . labetalol (NORMODYNE) 300 MG tablet Take 300 mg by mouth 3 (three) times daily.      Marland Kitchen oxyCODONE-acetaminophen (PERCOCET/ROXICET) 5-325 MG per  tablet Take 1-2 tablets by mouth every 4 (four) hours as needed.  30 tablet  0  . Prenatal Vit-Fe Fumarate-FA (MULTIVITAMIN-PRENATAL) 27-0.8 MG TABS Take 1 tablet by mouth daily at 12 noon.       No current facility-administered medications on file prior to visit.   The PMH, PSH, Social History, Family History, Medications, and allergies have been reviewed in [facility], and have been updated if relevant.   Review of Systems See HPI Patient reports no  vision/ hearing changes,anorexia, weight change, fever ,adenopathy, persistant / recurrent hoarseness, swallowing issues, chest pain, edema,persistant / recurrent cough, hemoptysis, dyspnea(rest, exertional, paroxysmal nocturnal), gastrointestinal  bleeding (melena, rectal bleeding), abdominal pain, excessive heart burn, GU  symptoms(dysuria, hematuria, pyuria, voiding/incontinence  Issues) syncope, focal weakness, severe memory loss, concerning skin lesions, depression, anxiety, abnormal bruising/bleeding, major joint swelling, breast masses or abnormal vaginal bleeding.       Objective:   Physical Exam BP 112/84  Pulse 65  Temp(Src) 98 F (36.7 C) (Oral)  Wt 242 lb 4 oz (109.884 kg)  BMI 36.84 kg/m2  SpO2 97%  Breastfeeding? No  Gen: pleaseant, well-nourished,in no acute distress; alert,appropriate and cooperative throughout examination Head:  normocephalic and atraumatic.   Eyes:  vision grossly intact, pupils equal, pupils round, and pupils reactive to light.   Ears:  R ear normal and L ear normal.   Nose:  no external deformity.   Mouth:  good dentition.   Lungs:  Normal respiratory effort, chest expands symmetrically. Lungs are clear to auscultation, no crackles or wheezes. Heart:  Normal rate and regular rhythm. S1 and S2 normal without gallop, murmur, click, rub or other extra sounds. Abdomen:  Bowel sounds positive,abdomen soft and non-tender without masses, organomegaly or hernias noted. Msk:  No deformity or scoliosis noted of thoracic or lumbar spine.   Extremities:  No clubbing, cyanosis, edema, or deformity noted with normal full range of motion of all joints.   Neurologic:  alert & oriented X3 and gait normal.   Skin:  Intact without suspicious lesions or rashes Psych:  Cognition and judgment appear intact. Alert and cooperative with normal attention span and concentration. No apparent delusions, illusions, hallucinations     Assessment & Plan:   1. HTN (hypertension) Stable, at times hypotensive. D/c labetolol. Start lisinopril 20 mg daily. She will call me in 1-2 weeks with BP readings.

## 2013-08-07 ENCOUNTER — Other Ambulatory Visit: Payer: Self-pay

## 2013-11-04 ENCOUNTER — Other Ambulatory Visit (HOSPITAL_COMMUNITY): Payer: Self-pay | Admitting: Obstetrics

## 2013-11-04 DIAGNOSIS — Z1231 Encounter for screening mammogram for malignant neoplasm of breast: Secondary | ICD-10-CM

## 2013-11-12 ENCOUNTER — Ambulatory Visit (HOSPITAL_COMMUNITY): Payer: BC Managed Care – PPO

## 2013-11-13 ENCOUNTER — Ambulatory Visit (HOSPITAL_COMMUNITY)
Admission: RE | Admit: 2013-11-13 | Discharge: 2013-11-13 | Disposition: A | Discharge status: Home / Self Care | Payer: BC Managed Care – PPO | Source: Ambulatory Visit | Attending: Obstetrics | Admitting: Obstetrics

## 2013-11-13 DIAGNOSIS — Z1231 Encounter for screening mammogram for malignant neoplasm of breast: Secondary | ICD-10-CM

## 2013-11-24 ENCOUNTER — Encounter: Payer: Self-pay | Admitting: Internal Medicine

## 2013-11-24 ENCOUNTER — Ambulatory Visit (INDEPENDENT_AMBULATORY_CARE_PROVIDER_SITE_OTHER): Payer: BC Managed Care – PPO | Admitting: Internal Medicine

## 2013-11-24 VITALS — BP 124/82 | HR 58 | Temp 98.2°F | Wt 244.8 lb

## 2013-11-24 DIAGNOSIS — B309 Viral conjunctivitis, unspecified: Secondary | ICD-10-CM

## 2013-11-24 NOTE — Progress Notes (Signed)
Pre visit review using our clinic review tool, if applicable. No additional management support is needed unless otherwise documented below in the visit note. 

## 2013-11-24 NOTE — Patient Instructions (Addendum)

## 2013-11-24 NOTE — Progress Notes (Signed)
Subjective:    Patient ID: Jill Cruz, female    DOB: November 02, 1965, 48 y.o.   MRN: [id number]  HPI  Pt presents to the clinic today with c/o possible pink eye. This started yesterday. She has noticed mild itching, swelling and tenderness. She does report having some crust in her eye this morning. She has not tried anything OTC. Her children have been sick recently.  Review of Systems      Past Medical History  Diagnosis Date  . History of pre-eclampsia in prior pregnancy, currently pregnant   . PONV (postoperative nausea and vomiting)   . Hypertension     On Labetalol  . Anxiety 2011    Post Partum Anxiety/Depression treated w/ medication. Now resolved.  Marland Kitchen GERD (gastroesophageal reflux disease)     Mild baseline- worse w/pregnancy. Takes TUMS prn.  . Postpartum care following cesarean delivery (03/27/13) 03/27/2013  . MTHFR mutation 03/27/2013  . S/P repeat low transverse C-section 03/30/2013    Current Outpatient Prescriptions  Medication Sig Dispense Refill  . aspirin EC 81 MG tablet Take 81 mg by mouth daily.      . calcium carbonate (TUMS - DOSED IN MG ELEMENTAL CALCIUM) 500 MG chewable tablet Chew 1 tablet by mouth every 4 (four) hours as needed for heartburn.      . docusate sodium (COLACE) 100 MG capsule Take 1 capsule (100 mg total) by mouth 2 (two) times daily.  60 capsule  2  . folic acid (FOLVITE) 774 MCG tablet Take 400 mcg by mouth daily.      Marland Kitchen ibuprofen (ADVIL,MOTRIN) 600 MG tablet Take 1 tablet (600 mg total) by mouth every 6 (six) hours as needed for pain.  30 tablet  1  . iron polysaccharides (NIFEREX) 150 MG capsule Take 1 capsule (150 mg total) by mouth 2 (two) times daily with a meal.  60 capsule  2  . lisinopril (PRINIVIL,ZESTRIL) 20 MG tablet Take 1 tablet (20 mg total) by mouth daily.  90 tablet  3   No current facility-administered medications for this visit.    Allergies  Allergen Reactions  . Codeine Nausea And Vomiting    Family History    Problem Relation Age of Onset  . Hypertension Mother   . Diabetes Mother   . Arthritis Mother   . Arthritis Father     History   Social History  . Marital Status: Married    Spouse Name: N/A    Number of Children: N/A  . Years of Education: N/A   Occupational History  . Not on file.   Social History Main Topics  . Smoking status: Never Smoker   . Smokeless tobacco: Not on file  . Alcohol Use: No  . Drug Use: No  . Sexual Activity: Not on file   Other Topics Concern  . Not on file   Social History Narrative   Married.   One son, [name] (age 4).   [redacted] weeks pregnant- 11/2011.   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     Constitutional: Denies fever, malaise, fatigue, headache or abrupt weight changes.  HEENT: Pt reports eye pain and redness. Denies ear pain, ringing in the ears, wax buildup, runny nose, nasal congestion, bloody nose, or sore throat.   No other specific complaints in a complete review of systems (except as listed in HPI above).  Objective:   Physical Exam   BP 124/82  Pulse 58  Temp(Src) 98.2 F (36.8 C) (Oral)  Wt 244  lb 12.8 oz (111.041 kg)  SpO2 98% Wt Readings from Last 3 Encounters:  11/24/13 244 lb 12.8 oz (111.041 kg)  05/16/13 242 lb 4 oz (109.884 kg)  03/27/13 257 lb (116.574 kg)    General: Appears her stated age, well developed, well nourished in NAD. HEENT: Head: normal shape and size; Eyes: sclera injected, no icterus, conjunctiva erythematous, clear drainage noted, PERRLA and EOMs intact; Ears: Tm's gray and intact, normal light reflex. Cardiovascular: Normal rate and rhythm. S1,S2 noted.  No murmur, rubs or gallops noted. No JVD or BLE edema. No carotid bruits noted. Pulmonary/Chest: Normal effort and positive vesicular breath sounds. No respiratory distress. No wheezes, rales or ronchi noted.      BMET    Component Value Date/Time   NA 137 03/29/2013 0610   K 4.3 03/29/2013 0610   CL 104 03/29/2013 0610   CO2 27 03/29/2013 0610    GLUCOSE 90 03/29/2013 0610   BUN 8 03/29/2013 0610   CREATININE 0.69 03/29/2013 0610   CALCIUM 8.8 03/29/2013 0610   GFRNONAA >90 03/29/2013 0610   GFRAA >90 03/29/2013 0610    Lipid Panel  No results found for this basename: chol, trig, hdl, cholhdl, vldl, ldlcalc    CBC    Component Value Date/Time   WBC 12.0* 03/29/2013 0610   RBC 2.80* 03/29/2013 0610   HGB 7.9* 03/29/2013 0610   HCT 23.3* 03/29/2013 0610   PLT 270 03/29/2013 0610   MCV 83.2 03/29/2013 0610   MCH 28.2 03/29/2013 0610   MCHC 33.9 03/29/2013 0610   RDW 14.0 03/29/2013 0610    Hgb A1C No results found for this basename: HGBA1C         Assessment & Plan:   Viral Conjunctivitis:  No indication for abx at this time Reassurance given that this should resolve on its own in 3-5 days Avoid touching your eye with your hands Warm compresses to affected eye TID for comfort Ibuprofen as needed for swelling/pain  RTC as needed or if symptoms persist

## 2014-01-14 ENCOUNTER — Ambulatory Visit (INDEPENDENT_AMBULATORY_CARE_PROVIDER_SITE_OTHER): Payer: BC Managed Care – PPO | Admitting: Family Medicine

## 2014-01-14 ENCOUNTER — Encounter: Payer: Self-pay | Admitting: Family Medicine

## 2014-01-14 VITALS — BP 122/72 | HR 64 | Temp 97.5°F | Ht 67.0 in | Wt 249.5 lb

## 2014-01-14 DIAGNOSIS — J321 Chronic frontal sinusitis: Secondary | ICD-10-CM

## 2014-01-14 MED ORDER — AMOXICILLIN-POT CLAVULANATE 875-125 MG PO TABS: 1.0000 | ORAL_TABLET | Freq: Two times a day (BID) | ORAL | Status: DC

## 2014-01-14 NOTE — Progress Notes (Signed)
Pre visit review using our clinic review tool, if applicable. No additional management support is needed unless otherwise documented below in the visit note. 

## 2014-01-14 NOTE — Progress Notes (Signed)
   Date:  01/14/2014   Name:  Jill Cruz   DOB:  August 09, 1966   MRN:  [id number] Gender: female Age: 48 y.o.  Primary Physician:  Arnette Norris, MD   Chief Complaint: Sinus Problem   Subjective:   History of Present Illness:  Jill Cruz is a 48 y.o. very pleasant female patient who presents with the following:  Left side of headd, left eye, left ear.  3 weeks ago, flew to [place] and got a bad cough. That has gotten a lot better.   The patient had a cold about 3 weeks ago, and then she has had some persistent symptoms and persistent cough, and ultimately she started developed some facial pain and maxillary pain as well as ear pain, all of the left side. This progressed over the last few weeks in the continued to get worse over the last few days.  Past Medical History, Surgical History, Social History, Family History, Problem List, Medications, and Allergies have been reviewed and updated if relevant.  Review of Systems: ROS: GEN: Acute illness details above GI: Tolerating PO intake GU: maintaining adequate hydration and urination Pulm: No SOB Interactive and getting along well at home.  Otherwise, ROS is as per the HPI.   Objective:   Physical Examination: BP 122/72  Pulse 64  Temp(Src) 97.5 F (36.4 C) (Oral)  Ht 5\' 7"  (1.702 m)  Wt 249 lb 8 oz (113.172 kg)  BMI 39.07 kg/m2  SpO2 96%   Gen: WDWN, NAD; alert,appropriate and cooperative throughout exam  HEENT: Normocephalic and atraumatic. Throat clear, w/o exudate, no LAD, R TM clear, L TM - good landmarks, No fluid present. rhinnorhea.  Left frontal and maxillary sinuses: Tender Right frontal and maxillary sinuses: nonTender  Neck: No ant or post LAD CV: RRR, No M/G/R Pulm: Breathing comfortably in no resp distress. no w/c/r Abd: S,NT,ND,+BS Extr: no c/c/e Psych: full affect, pleasant    Laboratory and Imaging Data:  Assessment & Plan:   Frontal sinusitis  Classic history, treated with  antibiotics and supportive care  Follow-up: No Follow-up on file.  New Prescriptions   AMOXICILLIN-CLAVULANATE (AUGMENTIN) 875-125 MG PER TABLET    Take 1 tablet by mouth 2 (two) times daily.   No orders of the defined types were placed in this encounter.   There are no Patient Instructions on file for this visit.  Signed,  Maud Deed. Damani Rando, MD, [facility] [place] [address] Phone: [phone] Fax: [phone]  Patient's Medications  New Prescriptions   AMOXICILLIN-CLAVULANATE (AUGMENTIN) 875-125 MG PER TABLET    Take 1 tablet by mouth 2 (two) times daily.  Previous Medications   FOLIC ACID (FOLVITE) 952 MCG TABLET    Take 400 mcg by mouth daily.   LISINOPRIL (PRINIVIL,ZESTRIL) 20 MG TABLET    Take 1 tablet (20 mg total) by mouth daily.  Modified Medications   No medications on file  Discontinued Medications   No medications on file

## 2014-03-27 ENCOUNTER — Ambulatory Visit (INDEPENDENT_AMBULATORY_CARE_PROVIDER_SITE_OTHER)
Admission: RE | Admit: 2014-03-27 | Discharge: 2014-03-27 | Disposition: A | Discharge status: Home / Self Care | Payer: BC Managed Care – PPO | Source: Ambulatory Visit | Attending: Internal Medicine | Admitting: Internal Medicine

## 2014-03-27 ENCOUNTER — Ambulatory Visit (INDEPENDENT_AMBULATORY_CARE_PROVIDER_SITE_OTHER): Payer: BC Managed Care – PPO | Admitting: Internal Medicine

## 2014-03-27 ENCOUNTER — Encounter: Payer: Self-pay | Admitting: Internal Medicine

## 2014-03-27 VITALS — BP 116/68 | HR 70 | Temp 98.1°F | Ht 67.0 in | Wt 227.2 lb

## 2014-03-27 DIAGNOSIS — M25519 Pain in unspecified shoulder: Secondary | ICD-10-CM

## 2014-03-27 DIAGNOSIS — B379 Candidiasis, unspecified: Secondary | ICD-10-CM

## 2014-03-27 DIAGNOSIS — M25511 Pain in right shoulder: Secondary | ICD-10-CM

## 2014-03-27 DIAGNOSIS — T3695XA Adverse effect of unspecified systemic antibiotic, initial encounter: Secondary | ICD-10-CM

## 2014-03-27 DIAGNOSIS — N390 Urinary tract infection, site not specified: Secondary | ICD-10-CM

## 2014-03-27 DIAGNOSIS — R109 Unspecified abdominal pain: Secondary | ICD-10-CM

## 2014-03-27 LAB — POCT URINALYSIS DIPSTICK
Bilirubin, UA: NEGATIVE
Glucose, UA: NEGATIVE
KETONES UA: NEGATIVE
Nitrite, UA: NEGATIVE
PH UA: 6
PROTEIN UA: NEGATIVE
Spec Grav, UA: 1.02
UROBILINOGEN UA: 0.2

## 2014-03-27 MED ORDER — NITROFURANTOIN MONOHYD MACRO 100 MG PO CAPS: 100.0000 mg | ORAL_CAPSULE | Freq: Two times a day (BID) | ORAL | Status: DC

## 2014-03-27 MED ORDER — FLUCONAZOLE 150 MG PO TABS: 150.0000 mg | ORAL_TABLET | Freq: Once | ORAL | Status: DC

## 2014-03-27 NOTE — Progress Notes (Signed)
Pre visit review using our clinic review tool, if applicable. No additional management support is needed unless otherwise documented below in the visit note. 

## 2014-03-27 NOTE — Progress Notes (Signed)
HPI  Pt presents to the clinic today with c/o RLQ pain that radiates to her back. This started aboout 2 months ago. She describes the pain crampy. She denies fever, chills, or nausea. She is having normal bowel movements. She did go to her GYN for the same issue. Pelvic ultrasound was negative for ovarian cyst for which she has had a history of in the past. 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 She denies urinary symptoms.  Additionally, she c/o right shoulder pain. This also started about 6 months ago. She denies weakness, numbness or tingling. She did have a prior weight lifting injury with that shoulder but never had it examined. She denies weakness in the area. She has tried Ibuprofen OTC with minimal relief.  Review of Systems  Past Medical History  Diagnosis Date  . History of pre-eclampsia in prior pregnancy, currently pregnant   . PONV (postoperative nausea and vomiting)   . Hypertension     On Labetalol  . Anxiety 2011    Post Partum Anxiety/Depression treated w/ medication. Now resolved.  Marland Kitchen GERD (gastroesophageal reflux disease)     Mild baseline- worse w/pregnancy. Takes TUMS prn.  . Postpartum care following cesarean delivery (03/27/13) 03/27/2013  . MTHFR mutation 03/27/2013  . S/P repeat low transverse C-section 03/30/2013    Family History  Problem Relation Age of Onset  . Hypertension Mother   . Diabetes Mother   . Arthritis Mother   . Arthritis Father     History   Social History  . Marital Status: Married    Spouse Name: N/A    Number of Children: N/A  . Years of Education: N/A   Occupational History  . Not on file.   Social History Main Topics  . Smoking status: Never Smoker   . Smokeless tobacco: Not on file  . Alcohol Use: No  . Drug Use: No  . Sexual Activity: Not on file   Other Topics Concern  . Not on file   Social History Narrative   Married.   One son, [name] (age 52).   [redacted] weeks pregnant- 11/2011.   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    Allergies  Allergen Reactions  . Codeine Nausea And Vomiting    Constitutional: Denies fever, malaise, fatigue, headache or abrupt weight changes.   GU: Pt reports urgency, frequency and pain with urination. Denies burning sensation, blood in urine, odor or discharge. Skin: Denies redness, rashes, lesions or ulcercations.  MSK: Pt reports right shoulder pain. Denies weakness, difficulty with ROM or muscle pain.  No other specific complaints in a complete review of systems (except as listed in HPI above).    Objective:   Physical Exam  BP 116/68  Pulse 70  Temp(Src) 98.1 F (36.7 C) (Oral)  Ht 5\' 7"  (1.702 m)  Wt 227 lb 4 oz (103.08 kg)  BMI 35.58 kg/m2  SpO2 98%  LMP 02/28/2014  Breastfeeding? No Wt Readings from Last 3 Encounters:  03/27/14 227 lb 4 oz (103.08 kg)  01/14/14 249 lb 8 oz (113.172 kg)  11/24/13 244 lb 12.8 oz (111.041 kg)    General: Appears her stated age, obese but well developed, well nourished in NAD. Cardiovascular: Normal rate and rhythm. S1,S2 noted.  No murmur, rubs or gallops noted. No JVD or BLE edema. No carotid bruits noted. Pulmonary/Chest: Normal effort and positive vesicular breath sounds. No respiratory distress. No wheezes, rales or ronchi noted.  Abdomen: Soft and nontender.  Normal bowel sounds, no bruits noted. No distention or masses noted. Liver, spleen and kidneys non palpable. Tender to palpation over the bladder area. No CVA tenderness. MSK: Normal internal and external rotation of the right shoulder. Strength 5/5 BUE. Negative drop can test.      Assessment & Plan:   Flank pain secondary to UTI  Urinalysis: moderate blood, trace leuks Will send for urine culture eRx sent if for Macrobid 100 mg BID x 5 days If no improvement with RLQ pain , follow up with gyn to discuss endometriosis. eRx for Diflucan for antibiotic induced yeast infection OK to take AZO OTC  Right shoulder pain:  Will obtain  xray today  Continue ibuprofen for pain May need PT if xray normal Drink plenty of fluids  RTC as needed or if symptoms persist.

## 2014-03-27 NOTE — Patient Instructions (Addendum)

## 2014-03-28 LAB — URINE CULTURE: Colony Count: 35000

## 2014-04-09 ENCOUNTER — Ambulatory Visit (INDEPENDENT_AMBULATORY_CARE_PROVIDER_SITE_OTHER): Payer: BC Managed Care – PPO | Admitting: Family Medicine

## 2014-04-09 ENCOUNTER — Encounter: Payer: Self-pay | Admitting: Family Medicine

## 2014-04-09 VITALS — BP 108/80 | HR 69 | Temp 97.8°F | Ht 67.0 in | Wt 223.5 lb

## 2014-04-09 DIAGNOSIS — M6790 Unspecified disorder of synovium and tendon, unspecified site: Secondary | ICD-10-CM

## 2014-04-09 DIAGNOSIS — G2589 Other specified extrapyramidal and movement disorders: Secondary | ICD-10-CM

## 2014-04-09 DIAGNOSIS — M778 Other enthesopathies, not elsewhere classified: Secondary | ICD-10-CM

## 2014-04-09 DIAGNOSIS — IMO0002 Reserved for concepts with insufficient information to code with codable children: Secondary | ICD-10-CM

## 2014-04-09 DIAGNOSIS — R279 Unspecified lack of coordination: Secondary | ICD-10-CM

## 2014-04-09 DIAGNOSIS — M719 Bursopathy, unspecified: Secondary | ICD-10-CM

## 2014-04-09 DIAGNOSIS — M75100 Unspecified rotator cuff tear or rupture of unspecified shoulder, not specified as traumatic: Secondary | ICD-10-CM

## 2014-04-09 DIAGNOSIS — M67911 Unspecified disorder of synovium and tendon, right shoulder: Secondary | ICD-10-CM

## 2014-04-09 DIAGNOSIS — M7581 Other shoulder lesions, right shoulder: Secondary | ICD-10-CM

## 2014-04-09 NOTE — Progress Notes (Signed)
Pre visit review using our clinic review tool, if applicable. No additional management support is needed unless otherwise documented below in the visit note. 

## 2014-04-09 NOTE — Progress Notes (Signed)
[place] [address] Phone: [phone] Fax: [phone]  Patient ID: Jill Cruz MRN: [id number], DOB: 27-Jun-1966, 48 y.o. Date of Encounter: 04/09/2014  Primary Physician:  Arnette Norris, MD   Chief Complaint: Shoulder Pain   Subjective:   History of Present Illness:  This 48 y.o. female patient noted above presents with shoulder pain that has been ongoing for 2-3 mo with 6 years ago, initial injury there is no history of trauma or accident recently The patient denies neck pain or radicular symptoms. Denies dislocation, subluxation, separation of the shoulder. The patient does complain of pain in the overhead plane with significant painful arc of motion.  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 Day after that not hurting as bad.   1 x < 1 minute, R arm.  Medications Tried: Tylenol, NSAIDS Ice or Heat: minimally helpful Tried PT: No  Prior shoulder Injury: No Prior surgery: No Prior fracture: No  The PMH, PSH, Social History, Family History, Medications, and allergies have been reviewed in [facility], and have been updated if relevant.  REVIEW OF SYSTEMS  GEN: No fevers, chills. Nontoxic. Primarily MSK c/o today. MSK: Detailed in the HPI GI: tolerating PO intake without difficulty Neuro: No numbness, parasthesias, or tingling associated. Otherwise the pertinent positives of the ROS are noted above.   Objective:   PHYSICAL EXAM  Blood pressure 108/80, pulse 69, temperature 97.8 F (36.6 C), temperature source Oral, height 5\' 7"  (1.702 m), weight 223 lb 8 oz (101.379 kg), last menstrual period 02/28/2014, not currently breastfeeding.  GEN: Well-developed,well-nourished,in no acute distress; alert,appropriate and cooperative throughout examination HEENT: Normocephalic and atraumatic without obvious abnormalities. Ears, externally no deformities PULM: Breathing comfortably in no  respiratory distress EXT: No clubbing, cyanosis, or edema PSYCH: Normally interactive. Cooperative during the interview. Pleasant. Friendly and conversant. Not anxious or depressed appearing. Normal, full affect.  Shoulder: R Inspection: No muscle wasting or winging Ecchymosis/edema: neg  AC joint, scapula, clavicle: NT Cervical spine: NT, full ROM Spurling's: neg Abduction: full, 5/5 Flexion: full, 5/5 IR, full, lift-off: 5/5, 70 deg loss of motion compared to the L ER at neutral: full, 5/5 AC crossover: neg Neer: pos Hawkins: pos Drop Test: neg Empty Can: pos Supraspinatus insertion: mild-mod T Bicipital groove: NT Speed's: neg Yergason's: neg Sulcus sign: neg Scapular dyskinesis: noted C5-T1 intact  Neuro: Sensation intact Grip 5/5   Radiology: No results found.  Assessment & Plan:   Tendinopathy of rotator cuff, right - Plan: Ambulatory referral to Physical Therapy, CANCELED: Ambulatory referral to Physical Therapy  Subacromial tendinitis, right - Plan: Ambulatory referral to Physical Therapy, CANCELED: Ambulatory referral to Physical Therapy  Scapular dyskinesis - Plan: Ambulatory referral to Physical Therapy, CANCELED: Ambulatory referral to Physical Therapy  >25 minutes spent in face to face time with patient, >50% spent in counselling or coordination of care  All above +  GIRD (Glenohumeral Internal Rotational Deficiency), work on post capsule , thoracic mobility.   Rotator cuff strengthening and scapular stabilization exercises were reviewed with the patient. MOON shoulder protocol given to the patient. Retraining shoulder mechanics and function was emphasized to the patient with rehab done at least 5-6 days a week.  The patient could benefit from formal PT to assist with scapular stabilization and RTC strengthening.  New Prescriptions   No medications on file   Modified Medications   No medications on file  Orders Placed This Encounter  Procedures    . Ambulatory referral to Physical Therapy   Follow-up: Return in about 2 months (around 06/10/2014). Unless noted above, the patient is to follow-up if symptoms worsen. Red flags were reviewed with the patient.  Signed,  Maud Deed. Shamecca Whitebread, MD, [facility]   Discontinued Medications   FLUCONAZOLE (DIFLUCAN) 150 MG TABLET    Take 1 tablet (150 mg total) by mouth once.   NITROFURANTOIN, MACROCRYSTAL-MONOHYDRATE, (MACROBID) 100 MG CAPSULE    Take 1 capsule (100 mg total) by mouth 2 (two) times daily.   Current Medications at Discharge:   Medication List       This list is accurate as of: 04/09/14 11:59 PM.  Always use your most recent med list.               folic acid 259 MCG tablet  Commonly known as:  FOLVITE  Take 400 mcg by mouth daily.     lisinopril 20 MG tablet  Commonly known as:  PRINIVIL,ZESTRIL  Take 1 tablet (20 mg total) by mouth daily.     Vitamin D (Ergocalciferol) 50000 UNITS Caps capsule  Commonly known as:  DRISDOL  Take 50,000 Units by mouth every 7 (seven) days.

## 2014-04-09 NOTE — Patient Instructions (Addendum)
Alleve 2 tabs by mouth two times a day over the counter: Take at least for 2 - 3 weeks. This is equal to a prescripton strength dose (GENERIC CHEAPER EQUIVALENT IS NAPROXEN SODIUM)   PT:

## 2014-05-24 ENCOUNTER — Other Ambulatory Visit: Payer: Self-pay | Admitting: Family Medicine

## 2014-06-17 ENCOUNTER — Ambulatory Visit: Payer: BC Managed Care – PPO | Admitting: Family Medicine

## 2014-06-30 ENCOUNTER — Other Ambulatory Visit: Payer: Self-pay | Admitting: *Deleted

## 2014-08-03 ENCOUNTER — Encounter: Payer: Self-pay | Admitting: Family Medicine

## 2014-08-07 DIAGNOSIS — E782 Mixed hyperlipidemia: Secondary | ICD-10-CM | POA: Insufficient documentation

## 2014-08-07 DIAGNOSIS — R0602 Shortness of breath: Secondary | ICD-10-CM | POA: Insufficient documentation

## 2014-09-14 ENCOUNTER — Ambulatory Visit (INDEPENDENT_AMBULATORY_CARE_PROVIDER_SITE_OTHER): Payer: BC Managed Care – PPO | Admitting: Family Medicine

## 2014-09-14 ENCOUNTER — Encounter: Payer: Self-pay | Admitting: Family Medicine

## 2014-09-14 VITALS — BP 128/86 | HR 63 | Temp 98.3°F | Ht 67.0 in | Wt 242.5 lb

## 2014-09-14 DIAGNOSIS — M25561 Pain in right knee: Secondary | ICD-10-CM

## 2014-09-14 NOTE — Progress Notes (Signed)
Dr. Frederico Hamman T. Marcella Dunnaway, MD, [facility] [place], [address] Phone: [phone] Fax: [phone]  09/14/2014  Patient: Jill Cruz, MRN: [id number], DOB: June 03, 1966, 48 y.o.  Primary Physician:  Arnette Norris, MD  Chief Complaint: Knee Pain  Subjective:   Jill Cruz is a 48 y.o. very pleasant female patient who presents with the following:  Pleasant patient who is [redacted] weeks pregnant who has a history of arthroscopic partial meniscectomy as well as BTB anterior cruciate ligament reconstruction on the right in the year 2000 who presents with right-sided knee pain. She had some pain when she was walking her dogs who abruptly her, and she had quite a bit of difficulty over the weekend and was having difficulty walking. She has been limping some. She has not had any locking up, and she did have a couple of instances of buckling. She has not taken anything.  Past Medical History, Surgical History, Social History, Family History, Problem List, Medications, and Allergies have been reviewed and updated if relevant.  GEN: No fevers, chills. Nontoxic. Primarily MSK c/o today. MSK: Detailed in the HPI GI: tolerating PO intake without difficulty Neuro: No numbness, parasthesias, or tingling associated. Otherwise the pertinent positives of the ROS are noted above.   Objective:   BP 128/86 mmHg  Pulse 63  Temp(Src) 98.3 F (36.8 C) (Oral)  Ht 5\' 7"  (1.702 m)  Wt 242 lb 8 oz (109.997 kg)  BMI 37.97 kg/m2  LMP 08/04/2014  Breastfeeding? No   GEN: WDWN, NAD, Non-toxic, Alert & Oriented x 3 HEENT: Atraumatic, Normocephalic.  Ears and Nose: No external deformity. EXTR: No clubbing/cyanosis/edema NEURO: Normal gait.  PSYCH: Normally interactive. Conversant. Not depressed or anxious appearing.  Calm demeanor.   Knee:  R Gait: Normal heel toe pattern ROM: 0-120 Effusion: neg Echymosis or edema: none Patellar  tendon NT Painful PLICA: neg Patellar grind: negative Medial and lateral patellar facet loading: medial patella tender medial and lateral joint lines:medial joint line >> lateral Mcmurray's mild pain Flexion-pinch pain Varus and valgus stress: stable Lachman: neg Ant and Post drawer: neg Hip abduction, IR, ER: WNL Hip flexion str: 5/5 Hip abd: 5/5 Quad: 5/5 VMO atrophy:No Hamstring concentric and eccentric: 5/5   Radiology: No results found.  Assessment and Plan:   Right knee pain  By history, most likely thing would be meniscal tear. She may all have an arthritis exacerbation. She is currently pregnant, so I am not going to get a x-ray. We are also going to avoid NSAIDs. I counseled her to ice her knee as she needs it. She did take Tylenol for pain is bad. Also recommended a basic knee brace and support.  Signed,  Maud Deed. Geraldin Habermehl, MD   Patient's Medications  New Prescriptions   No medications on file  Previous Medications   [facility] B-12 1000 MCG/15ML LIQD       ESTRADIOL (ESTRACE) 2 MG TABLET       FOLIC ACID (FOLVITE) 1 MG TABLET       LABETALOL (NORMODYNE) 100 MG TABLET       PROGESTERONE IM    Inject into the muscle daily.  Modified Medications   No medications on file  Discontinued Medications   FOLIC ACID (FOLVITE) 329 MCG TABLET    Take 400 mcg by mouth daily.   LISINOPRIL (PRINIVIL,ZESTRIL) 20 MG TABLET    TAKE 1 TABLET BY MOUTH EVERY DAY   VITAMIN D, ERGOCALCIFEROL, (  DRISDOL) 50000 UNITS CAPS CAPSULE    Take 50,000 Units by mouth every 7 (seven) days.

## 2014-09-14 NOTE — Progress Notes (Signed)
Pre visit review using our clinic review tool, if applicable. No additional management support is needed unless otherwise documented below in the visit note. 

## 2014-11-15 ENCOUNTER — Other Ambulatory Visit: Payer: Self-pay | Admitting: Family Medicine

## 2014-12-14 ENCOUNTER — Other Ambulatory Visit: Payer: Self-pay | Admitting: Family Medicine

## 2014-12-14 ENCOUNTER — Encounter: Payer: Self-pay | Admitting: Family Medicine

## 2014-12-14 ENCOUNTER — Ambulatory Visit (INDEPENDENT_AMBULATORY_CARE_PROVIDER_SITE_OTHER): Payer: 59 | Admitting: Family Medicine

## 2014-12-14 VITALS — BP 110/68 | HR 92 | Temp 98.1°F | Wt 244.8 lb

## 2014-12-14 DIAGNOSIS — R51 Headache: Secondary | ICD-10-CM

## 2014-12-14 DIAGNOSIS — R519 Headache, unspecified: Secondary | ICD-10-CM

## 2014-12-14 DIAGNOSIS — I10 Essential (primary) hypertension: Secondary | ICD-10-CM

## 2014-12-14 MED ORDER — LISINOPRIL 10 MG PO TABS: 10.0000 mg | ORAL_TABLET | Freq: Every day | ORAL | Status: DC

## 2014-12-14 NOTE — Progress Notes (Signed)
Subjective:    Patient ID: Jill Cruz, female    DOB: 1965/11/10, 49 y.o.   MRN: [id number]  HPI  Very pleasant 49 yo female whom I have seen only twice previously here for blood pressure rx, here for blood pressure follow up.  Unfortunately did get pregnant with twins through IVF in 09/2014 but miscarried both of them.  Had severe pre eclampsia with first pregnancy- son delivered via emergency C Section at 26 weeks.  Was on labetalol 200 mg three times daily throughout her pregnancies.  OB asked her to follow up with me to now that she restarted her lisinopril 20 mg daily.    Denies SOB, or CP.  She does note that maybe she has been a little dizzy when she stands from a seated position upon questioning.   Lab Results  Component Value Date   CREATININE 0.69 03/29/2013    Patient Active Problem List   Diagnosis Date Noted  . Headache 12/14/2014  . HTN (hypertension) 05/16/2013  . S/P repeat low transverse C-section 03/30/2013   Past Medical History  Diagnosis Date  . History of pre-eclampsia in prior pregnancy, currently pregnant   . PONV (postoperative nausea and vomiting)   . Hypertension     On Labetalol  . Anxiety 2011    Post Partum Anxiety/Depression treated w/ medication. Now resolved.  Marland Kitchen GERD (gastroesophageal reflux disease)     Mild baseline- worse w/pregnancy. Takes TUMS prn.  . Postpartum care following cesarean delivery (03/27/13) 03/27/2013  . MTHFR mutation 03/27/2013  . S/P repeat low transverse C-section 03/30/2013   Past Surgical History  Procedure Laterality Date  . [facility] section  2011    at [facility]  . Cholecystectomy  ~2003    [facility] in [place]  . Knee arthroscopy w/ acl reconstruction and patella graft Right 2000    [facility]. in [place]  . Diagnostic laparoscopy Left ~2008    Ovarian Cyst removed- [facility] in [place]  . Cesarean section N/A 03/27/2013    Procedure: REPEAT CESAREAN SECTION;  Surgeon: Claiborne Billings A. Pamala Hurry, MD;  Location: [facility];  Service: Obstetrics;  Laterality: N/A;  Repeat C/S  EDD: 04/23/13  . Knee arthroscopy w/ partial medial meniscectomy Right 2000   History  Substance Use Topics  . Smoking status: Never Smoker   . Smokeless tobacco: Never Used  . Alcohol Use: No   Family History  Problem Relation Age of Onset  . Hypertension Mother   . Diabetes Mother   . Arthritis Mother   . Arthritis Father    Allergies  Allergen Reactions  . Codeine Nausea And Vomiting   Current Outpatient Prescriptions on File Prior to Visit  Medication Sig Dispense Refill  . CVS B-12 1000 SEG/31DV LIQD   0  . folic acid (FOLVITE) 1 MG tablet     . lisinopril (PRINIVIL,ZESTRIL) 20 MG tablet Take 1 tablet (20 mg total) by mouth daily. * Needs a follow up appointment with Dr Deborra Medina for refills* 30 tablet 0   No current facility-administered medications on file prior to visit.   The PMH, PSH, Social History, Family History, Medications, and allergies have been reviewed in [facility], and have been updated if relevant.   Review of Systems Review of Systems  Constitutional: Negative.   Respiratory: Negative.   Cardiovascular: Negative.   Neurological: Positive for dizziness.        Objective:   Physical Exam BP 110/68 mmHg  Pulse 92  Temp(Src) 98.1 F (36.7 C) (  Oral)  Wt 244 lb 12 oz (111.018 kg)  SpO2 97%  LMP 08/04/2014  Breastfeeding? No BP Readings from Last 3 Encounters:  12/14/14 110/68  09/14/14 128/86  04/09/14 108/80    Gen: obese, pleasant, well-nourished,in no acute distress; alert,appropriate and cooperative throughout examination Head:  normocephalic and atraumatic.   Eyes:  vision grossly intact, pupils equal, pupils round, and pupils reactive to light.   Ears:  R ear normal and L ear normal.   Nose:  no external deformity.   Mouth:  good dentition.   Lungs:  Normal respiratory effort, chest expands symmetrically. Lungs are clear to auscultation, no crackles or wheezes. Heart:  Normal rate and  regular rhythm. S1 and S2 normal without gallop, murmur, click, rub or other extra sounds. Abdomen:  Bowel sounds positive,abdomen soft and non-tender without masses, organomegaly or hernias noted. Msk:  No deformity or scoliosis noted of thoracic or lumbar spine.   Extremities:  No clubbing, cyanosis, edema, or deformity noted with normal full range of motion of all joints.   Neurologic:  alert & oriented X3 and gait normal.   Skin:  Intact without suspicious lesions or rashes Psych:  Cognition and judgment appear intact. Alert and cooperative with normal attention span and concentration. No apparent delusions, illusions, hallucinations     Assessment & Plan:

## 2014-12-14 NOTE — Assessment & Plan Note (Signed)
Now with signs of orthostasis/hypotension on lisinopril 20 mg daily. Will decrease dose of lisinopril to 10 mg daily. Follow up in 2 weeks.  Had blood work done with OB recently- will request these records. The patient indicates understanding of these issues and agrees with the plan.

## 2014-12-14 NOTE — Progress Notes (Signed)
Pre visit review using our clinic review tool, if applicable. No additional management support is needed unless otherwise documented below in the visit note. 

## 2014-12-14 NOTE — Patient Instructions (Signed)
Good to see you. We are decreasing your lisinopril 10 mg daily. Please schedule a quick follow up visit in 2 weeks to recheck your blood pressure.

## 2014-12-15 ENCOUNTER — Telehealth: Payer: Self-pay | Admitting: Family Medicine

## 2014-12-15 NOTE — Telephone Encounter (Signed)
emmi emailed °

## 2014-12-28 ENCOUNTER — Encounter: Payer: Self-pay | Admitting: Family Medicine

## 2014-12-28 ENCOUNTER — Ambulatory Visit (INDEPENDENT_AMBULATORY_CARE_PROVIDER_SITE_OTHER): Payer: 59 | Admitting: Family Medicine

## 2014-12-28 VITALS — BP 116/68 | HR 78 | Temp 98.4°F | Wt 242.5 lb

## 2014-12-28 DIAGNOSIS — I10 Essential (primary) hypertension: Secondary | ICD-10-CM | POA: Diagnosis not present

## 2014-12-28 NOTE — Progress Notes (Signed)
Pre visit review using our clinic review tool, if applicable. No additional management support is needed unless otherwise documented below in the visit note. 

## 2014-12-28 NOTE — Progress Notes (Signed)
Subjective:   Patient ID: Jill Cruz, female    DOB: January 10, 1966, 49 y.o.   MRN: [id number]  Jill Cruz is a pleasant 49 y.o. year old female who presents to clinic today with Follow-up  on 12/28/2014  HPI:  Here for two week follow up.  When I saw her on 3/14, she was following up with me after being restarted on Lisinopril 20 mg daily by her OB.  At that [place], she has been complaining of sx of orthostasis, so we decreased her lisinopril to 10 mg daily.  Feels better.  No more dizziness from seated position.  No HA, CP, SOB, LE edema.  Current Outpatient Prescriptions on File Prior to Visit  Medication Sig Dispense Refill  . CVS B-12 1000 SWN/46EV LIQD   0  . folic acid (FOLVITE) 1 MG tablet     . lisinopril (PRINIVIL,ZESTRIL) 10 MG tablet Take 1 tablet (10 mg total) by mouth daily. 90 tablet 3   No current facility-administered medications on file prior to visit.    Allergies  Allergen Reactions  . Codeine Nausea And Vomiting    Past Medical History  Diagnosis Date  . History of pre-eclampsia in prior pregnancy, currently pregnant   . PONV (postoperative nausea and vomiting)   . Hypertension     On Labetalol  . Anxiety 2011    Post Partum Anxiety/Depression treated w/ medication. Now resolved.  Marland Kitchen GERD (gastroesophageal reflux disease)     Mild baseline- worse w/pregnancy. Takes TUMS prn.  . Postpartum care following cesarean delivery (03/27/13) 03/27/2013  . MTHFR mutation 03/27/2013  . S/P repeat low transverse C-section 03/30/2013    Past Surgical History  Procedure Laterality Date  . Cesarean section  2011    at [facility]  . Cholecystectomy  ~2003    [facility] in [place]  . Knee arthroscopy w/ acl reconstruction and patella graft Right 2000    [facility]. in [place]  . Diagnostic laparoscopy Left ~2008    Ovarian Cyst removed- [facility] in [place]  . Cesarean section N/A 03/27/2013    Procedure: REPEAT CESAREAN SECTION;  Surgeon: Claiborne Billings A. Pamala Hurry, MD;   Location: [facility];  Service: Obstetrics;  Laterality: N/A;  Repeat C/S  EDD: 04/23/13  . Knee arthroscopy w/ partial medial meniscectomy Right 2000    Family History  Problem Relation Age of Onset  . Hypertension Mother   . Diabetes Mother   . Arthritis Mother   . Arthritis Father     History   Social History  . Marital Status: Married    Spouse Name: N/A  . Number of Children: N/A  . Years of Education: N/A   Occupational History  . Not on file.   Social History Main Topics  . Smoking status: Never Smoker   . Smokeless tobacco: Never Used  . Alcohol Use: No  . Drug Use: No  . Sexual Activity: Not on file   Other Topics Concern  . Not on file   Social History Narrative   Married.   One son, Jill Cruz (age 33).   [redacted] weeks pregnant- 11/2011.   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   The PMH, PSH, Social History, Family History, Medications, and allergies have been reviewed in [facility], and have been updated if relevant.   Review of Systems  HENT: Negative.   Eyes: Negative.   Respiratory: Negative.   Cardiovascular: Negative.   Musculoskeletal: Negative.   Skin: Negative.   Neurological: Negative.  Hematological: Negative.   Psychiatric/Behavioral: Negative.   All other systems reviewed and are negative.      Objective:    BP 116/68 mmHg  Pulse 78  Temp(Src) 98.4 F (36.9 C) (Oral)  Wt 242 lb 8 oz (109.997 kg)  SpO2 97%  LMP 08/04/2014   Physical Exam  Constitutional: She appears well-developed and well-nourished. No distress.  HENT:  Head: Normocephalic and atraumatic.  Eyes: Conjunctivae are normal.  Neck: Normal range of motion.  Cardiovascular: Normal rate and regular rhythm.   Pulmonary/Chest: Effort normal and breath sounds normal. No respiratory distress.  Musculoskeletal: Normal range of motion. She exhibits no edema.  Skin: Skin is warm and dry.  Psychiatric: She has a normal mood and affect. Her behavior is normal. Judgment and thought content  normal.  Nursing note and vitals reviewed.         Assessment & Plan:   Essential hypertension No Follow-up on file.

## 2014-12-28 NOTE — Assessment & Plan Note (Signed)
Normotensive on current rx. Symptoms of hypotension resolved.  Continue current dose of lisinopril. No complaints today and VSS. Call or return to clinic prn if these symptoms worsen or fail to improve as anticipated. The patient indicates understanding of these issues and agrees with the plan.

## 2014-12-29 ENCOUNTER — Telehealth: Payer: Self-pay | Admitting: Family Medicine

## 2014-12-29 NOTE — Telephone Encounter (Signed)
emmi emailed °

## 2015-01-18 ENCOUNTER — Other Ambulatory Visit: Payer: Self-pay | Admitting: Family Medicine

## 2015-03-04 ENCOUNTER — Encounter: Payer: Self-pay | Admitting: Primary Care

## 2015-03-04 ENCOUNTER — Ambulatory Visit (INDEPENDENT_AMBULATORY_CARE_PROVIDER_SITE_OTHER): Payer: 59 | Admitting: Primary Care

## 2015-03-04 VITALS — BP 118/70 | HR 83 | Temp 97.7°F | Ht 67.0 in | Wt 246.8 lb

## 2015-03-04 DIAGNOSIS — H9203 Otalgia, bilateral: Secondary | ICD-10-CM

## 2015-03-04 DIAGNOSIS — H6123 Impacted cerumen, bilateral: Secondary | ICD-10-CM

## 2015-03-04 NOTE — Patient Instructions (Signed)
Your ear canals are slightly irritated from the removal of wax.  There appears to be no infection to your ear drums. Give your right ear a few days to rest before applying Debrox drops for the remaining ear wax. Call me if you develop fevers, chills, ear pain.  It was nice meeting you!

## 2015-03-04 NOTE — Progress Notes (Signed)
Pre visit review using our clinic review tool, if applicable. No additional management support is needed unless otherwise documented below in the visit note. 

## 2015-03-04 NOTE — Progress Notes (Signed)
Subjective:    Patient ID: Jill Cruz, female    DOB: [birth year], 49 y.o.   MRN: [id number]  HPI  Jill Cruz is a 49 year old female who presents today with a chief complaint of left ear pain. She's had some ear fullness and allergy symptoms for the past several weeks. She attempted to clean her ears out Saturday with a q-tip which did not help to reduce her fullness. She began to experience left ear pain Tuesday night and a decrease in hearing. She attempted again to clean her ears again last night with Debrox ear wax drops and a bulb syringe. Last night her pain was at its worst. Her pain has reduced to the left ear this morning. She also reports some left sided sinus pressure without nasal drainage.  Review of Systems  Constitutional: Negative for fever and chills.  HENT: Positive for ear pain and sinus pressure. Negative for congestion, rhinorrhea and sore throat.   Respiratory: Negative for cough and shortness of breath.   Cardiovascular: Negative for chest pain.  Musculoskeletal: Negative for myalgias.       Past Medical History  Diagnosis Date  . History of pre-eclampsia in prior pregnancy, currently pregnant   . PONV (postoperative nausea and vomiting)   . Hypertension     On Labetalol  . Anxiety 2011    Post Partum Anxiety/Depression treated w/ medication. Now resolved.  Marland Kitchen GERD (gastroesophageal reflux disease)     Mild baseline- worse w/pregnancy. Takes TUMS prn.  . Postpartum care following cesarean delivery (03/27/13) 03/27/2013  . MTHFR mutation 03/27/2013  . S/P repeat low transverse C-section 03/30/2013    History   Social History  . Marital Status: Married    Spouse Name: N/A  . Number of Children: N/A  . Years of Education: N/A   Occupational History  . Not on file.   Social History Main Topics  . Smoking status: Never Smoker   . Smokeless tobacco: Never Used  . Alcohol Use: No  . Drug Use: No  . Sexual Activity: Not on file   Other Topics  Concern  . Not on file   Social History Narrative   Married.   One son, Jill Cruz (age 109).   [redacted] weeks pregnant- 11/2011.   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    Past Surgical History  Procedure Laterality Date  . Cesarean section  2011    at [facility]  . Cholecystectomy  ~2003    [facility] in [place]  . Knee arthroscopy w/ acl reconstruction and patella graft Right 2000    [facility]. in [place]  . Diagnostic laparoscopy Left ~2008    Ovarian Cyst removed- [facility]. in [place]  . Cesarean section N/A 03/27/2013    Procedure: REPEAT CESAREAN SECTION;  Surgeon: Claiborne Billings A. Pamala Hurry, MD;  Location: [facility];  Service: Obstetrics;  Laterality: N/A;  Repeat C/S  EDD: 04/23/13  . Knee arthroscopy w/ partial medial meniscectomy Right 2000    Family History  Problem Relation Age of Onset  . Hypertension Mother   . Diabetes Mother   . Arthritis Mother   . Arthritis Father     Allergies  Allergen Reactions  . Codeine Nausea And Vomiting    Current Outpatient Prescriptions on File Prior to Visit  Medication Sig Dispense Refill  . CVS B-12 1000 ZHG/99ME LIQD   0  . folic acid (FOLVITE) 1 MG tablet TAKE 4 TABLETS BY MOUTH EVERY DAY 120 tablet 11  .  lisinopril (PRINIVIL,ZESTRIL) 10 MG tablet Take 1 tablet (10 mg total) by mouth daily. 90 tablet 3   No current facility-administered medications on file prior to visit.    BP 118/70 mmHg  Pulse 83  Temp(Src) 97.7 F (36.5 C) (Oral)  Ht 5\' 7"  (1.702 m)  Wt 246 lb 12.8 oz (111.948 kg)  BMI 38.65 kg/m2  SpO2 98%    Objective:   Physical Exam  Constitutional: She does not appear ill.  HENT:  Nose: Right sinus exhibits no maxillary sinus tenderness and no frontal sinus tenderness. Left sinus exhibits maxillary sinus tenderness. Left sinus exhibits no frontal sinus tenderness.  Mouth/Throat: Oropharynx is clear and moist.  Bilateral TM's impacted with cerumen. Some wax removed with plastic curette without visualization of TM's.   Cardiovascular:  Normal rate and regular rhythm.   Pulmonary/Chest: Effort normal and breath sounds normal.  Skin: Skin is warm and dry.          Assessment & Plan:  Cerumen Impaction:  Attempted to remove with plastic curette with some success but without visulaization of TM's. Irrigation completed by CMA with most cerumen removed. Right ear with some cerumen remaining, but due to irritation will allow patient to complete at home with Debrox drops starting Saturday. TM's post irrigation do not appear infected. Canal irritation noted from wax removal bilaterally. Patient regained immediate ability to hear out of both ears. Ibuprofen if development of pain. Call if fevers, chills, pain.

## 2015-03-11 ENCOUNTER — Encounter: Payer: Self-pay | Admitting: Internal Medicine

## 2015-03-11 ENCOUNTER — Ambulatory Visit (INDEPENDENT_AMBULATORY_CARE_PROVIDER_SITE_OTHER): Payer: 59 | Admitting: Internal Medicine

## 2015-03-11 VITALS — BP 116/74 | HR 72 | Temp 98.3°F | Wt 249.0 lb

## 2015-03-11 DIAGNOSIS — H6091 Unspecified otitis externa, right ear: Secondary | ICD-10-CM

## 2015-03-11 MED ORDER — FLUOCINOLONE ACETONIDE 0.01 % OT OIL: 5.0000 [drp] | TOPICAL_OIL | Freq: Two times a day (BID) | OTIC | Status: DC

## 2015-03-11 NOTE — Patient Instructions (Signed)
Otalgia  The most common reason for this in children is an infection of the middle ear. Pain from the middle ear is usually caused by a build-up of fluid and pressure behind the eardrum. Pain from an earache can be sharp, dull, or burning. The pain may be temporary or constant. The middle ear is connected to the nasal passages by a short narrow tube called the Eustachian tube. The Eustachian tube allows fluid to drain out of the middle ear, and helps keep the pressure in your ear equalized.  CAUSES   A cold or allergy can block the Eustachian tube with inflammation and the build-up of secretions. This is especially likely in small children, because their Eustachian tube is shorter and more horizontal. When the Eustachian tube closes, the normal flow of fluid from the middle ear is stopped. Fluid can accumulate and cause stuffiness, pain, hearing loss, and an ear infection if germs start growing in this area.  SYMPTOMS   The symptoms of an ear infection may include fever, ear pain, fussiness, increased crying, and irritability. Many children will have temporary and minor hearing loss during and right after an ear infection. Permanent hearing loss is rare, but the risk increases the more infections a child has. Other causes of ear pain include retained water in the outer ear canal from swimming and bathing.  Ear pain in adults is less likely to be from an ear infection. Ear pain may be referred from other locations. Referred pain may be from the joint between your jaw and the skull. It may also come from a tooth problem or problems in the neck. Other causes of ear pain include:   A foreign body in the ear.   Outer ear infection.   Sinus infections.   Impacted ear wax.   Ear injury.   Arthritis of the jaw or TMJ problems.   Middle ear infection.   Tooth infections.   Sore throat with pain to the ears.  DIAGNOSIS   Your caregiver can usually make the diagnosis by examining you. Sometimes other special studies,  including x-rays and lab work may be necessary.  TREATMENT    If antibiotics were prescribed, use them as directed and finish them even if you or your child's symptoms seem to be improved.   Sometimes PE tubes are needed in children. These are little plastic tubes which are put into the eardrum during a simple surgical procedure. They allow fluid to drain easier and allow the pressure in the middle ear to equalize. This helps relieve the ear pain caused by pressure changes.  HOME CARE INSTRUCTIONS    Only take over-the-counter or prescription medicines for pain, discomfort, or fever as directed by your caregiver. DO NOT GIVE CHILDREN ASPIRIN because of the association of Reye's Syndrome in children taking aspirin.   Use a cold pack applied to the outer ear for 15-20 minutes, 03-04 times per day or as needed may reduce pain. Do not apply ice directly to the skin. You may cause frost bite.   Over-the-counter ear drops used as directed may be effective. Your caregiver may sometimes prescribe ear drops.   Resting in an upright position may help reduce pressure in the middle ear and relieve pain.   Ear pain caused by rapidly descending from high altitudes can be relieved by swallowing or chewing gum. Allowing infants to suck on a bottle during airplane travel can help.   Do not smoke in the house or near children. If you are   unable to quit smoking, smoke outside.   Control allergies.  SEEK IMMEDIATE MEDICAL CARE IF:    You or your child are becoming sicker.   Pain or fever relief is not obtained with medicine.   You or your child's symptoms (pain, fever, or irritability) do not improve within 24 to 48 hours or as instructed.   Severe pain suddenly stops hurting. This may indicate a ruptured eardrum.   You or your children develop new problems such as severe headaches, stiff neck, difficulty swallowing, or swelling of the face or around the ear.  Document Released: 05/05/2004 Document Revised: 12/11/2011  Document Reviewed: 09/09/2008  Exit[employer] Patient Information 2015 [employer]. This information is not intended to replace advice given to you by your health care provider. Make sure you discuss any questions you have with your health care provider.

## 2015-03-11 NOTE — Progress Notes (Signed)
Pre visit review using our clinic review tool, if applicable. No additional management support is needed unless otherwise documented below in the visit note. 

## 2015-03-11 NOTE — Progress Notes (Signed)
Subjective:    Patient ID: Jill Cruz, female    DOB: 1966-05-09, 49 y.o.   MRN: [id number]  HPI  Pt presents to the clinic today with c/o right ear pain. This started 1 week ago after she was having wax removed from her right ear. They were not able to get the wax out and the ear canal became very irritated. The pain radiates down to her jaw. She denies loss of hearing. She has not noticed any drainage from the ear. She has not taken anything OTC.   Review of Systems      Past Medical History  Diagnosis Date  . History of pre-eclampsia in prior pregnancy, currently pregnant   . PONV (postoperative nausea and vomiting)   . Hypertension     On Labetalol  . Anxiety 2011    Post Partum Anxiety/Depression treated w/ medication. Now resolved.  Marland Kitchen GERD (gastroesophageal reflux disease)     Mild baseline- worse w/pregnancy. Takes TUMS prn.  . Postpartum care following cesarean delivery (03/27/13) 03/27/2013  . MTHFR mutation 03/27/2013  . S/P repeat low transverse C-section 03/30/2013    Current Outpatient Prescriptions  Medication Sig Dispense Refill  . CVS B-12 1000 ULA/45XM LIQD   0  . folic acid (FOLVITE) 1 MG tablet TAKE 4 TABLETS BY MOUTH EVERY DAY 120 tablet 11  . lisinopril (PRINIVIL,ZESTRIL) 10 MG tablet Take 1 tablet (10 mg total) by mouth daily. 90 tablet 3   No current facility-administered medications for this visit.    Allergies  Allergen Reactions  . Codeine Nausea And Vomiting    Family History  Problem Relation Age of Onset  . Hypertension Mother   . Diabetes Mother   . Arthritis Mother   . Arthritis Father     History   Social History  . Marital Status: Married    Spouse Name: N/A  . Number of Children: N/A  . Years of Education: N/A   Occupational History  . Not on file.   Social History Main Topics  . Smoking status: Never Smoker   . Smokeless tobacco: Never Used  . Alcohol Use: No  . Drug Use: No  . Sexual Activity: Not on file    Other Topics Concern  . Not on file   Social History Narrative   Married.   One son, [name] (age 57).   [redacted] weeks pregnant- 11/2011.   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     Constitutional: Denies fever, malaise, fatigue, headache or abrupt weight changes.  HEENT: Pt reports right ear pain. Denies eye pain, eye redness, ringing in the ears, wax buildup, runny nose, nasal congestion, bloody nose, or sore throat. Respiratory: Denies difficulty breathing, shortness of breath, cough or sputum production.   Cardiovascular: Denies chest pain, chest tightness, palpitations or swelling in the hands or feet.   No other specific complaints in a complete review of systems (except as listed in HPI above).  Objective:   Physical Exam  BP 116/74 mmHg  Pulse 72  Temp(Src) 98.3 F (36.8 C) (Oral)  Wt 249 lb (112.946 kg)  SpO2 99% Wt Readings from Last 3 Encounters:  03/11/15 249 lb (112.946 kg)  03/04/15 246 lb 12.8 oz (111.948 kg)  12/28/14 242 lb 8 oz (109.997 kg)    General: Appears her stated age, in NAD. HEENT: Right ear: No pain with palpation of the tragus or pulling back on the pinna. Ear canal is inflamed but not pus noted. Cerumen impaction still present.  Neck: No adenopathy noted.  Cardiovascular: Normal rate and rhythm. S1,S2 noted.   Pulmonary/Chest: Normal effort and positive vesicular breath sounds. No respiratory distress. No wheezes, rales or ronchi noted.    BMET    Component Value Date/Time   NA 137 03/29/2013 0610   K 4.3 03/29/2013 0610   CL 104 03/29/2013 0610   CO2 27 03/29/2013 0610   GLUCOSE 90 03/29/2013 0610   BUN 8 03/29/2013 0610   CREATININE 0.69 03/29/2013 0610   CALCIUM 8.8 03/29/2013 0610   GFRNONAA >90 03/29/2013 0610   GFRAA >90 03/29/2013 0610    Lipid Panel  No results found for: CHOL, TRIG, HDL, CHOLHDL, VLDL, LDLCALC  CBC    Component Value Date/Time   WBC 12.0* 03/29/2013 0610   RBC 2.80* 03/29/2013 0610   HGB 7.9* 03/29/2013 0610    HCT 23.3* 03/29/2013 0610   PLT 270 03/29/2013 0610   MCV 83.2 03/29/2013 0610   MCH 28.2 03/29/2013 0610   MCHC 33.9 03/29/2013 0610   RDW 14.0 03/29/2013 0610    Hgb A1C No results found for: HGBA1C       Assessment & Plan:   Inflammation of right ear:  eRx for Dermotic ear drips BID x 1 weeks Watch for fever, increased pain or drainage May need referral to ENT to remove cerumen impaction  RTC as needed or if symptoms persist or worsen

## 2015-03-17 ENCOUNTER — Other Ambulatory Visit (HOSPITAL_COMMUNITY): Payer: Self-pay | Admitting: Obstetrics

## 2015-03-17 DIAGNOSIS — Z1231 Encounter for screening mammogram for malignant neoplasm of breast: Secondary | ICD-10-CM

## 2015-03-25 ENCOUNTER — Ambulatory Visit: Payer: 59 | Admitting: Family Medicine

## 2015-03-26 ENCOUNTER — Ambulatory Visit (HOSPITAL_COMMUNITY)
Admission: RE | Admit: 2015-03-26 | Discharge: 2015-03-26 | Disposition: A | Discharge status: Home / Self Care | Payer: 59 | Source: Ambulatory Visit | Attending: Obstetrics | Admitting: Obstetrics

## 2015-03-26 ENCOUNTER — Other Ambulatory Visit (HOSPITAL_COMMUNITY): Payer: Self-pay | Admitting: Obstetrics

## 2015-03-26 DIAGNOSIS — Z1231 Encounter for screening mammogram for malignant neoplasm of breast: Secondary | ICD-10-CM | POA: Diagnosis not present

## 2015-04-07 ENCOUNTER — Emergency Department
Admission: EM | Admit: 2015-04-07 | Discharge: 2015-04-07 | Disposition: A | Discharge status: Home / Self Care | Payer: 59 | Attending: Emergency Medicine | Admitting: Emergency Medicine

## 2015-04-07 ENCOUNTER — Telehealth: Payer: Self-pay | Admitting: Family Medicine

## 2015-04-07 ENCOUNTER — Other Ambulatory Visit: Payer: Self-pay

## 2015-04-07 DIAGNOSIS — M5412 Radiculopathy, cervical region: Secondary | ICD-10-CM | POA: Diagnosis not present

## 2015-04-07 DIAGNOSIS — Z79899 Other long term (current) drug therapy: Secondary | ICD-10-CM | POA: Diagnosis not present

## 2015-04-07 DIAGNOSIS — R200 Anesthesia of skin: Secondary | ICD-10-CM | POA: Diagnosis present

## 2015-04-07 DIAGNOSIS — Z792 Long term (current) use of antibiotics: Secondary | ICD-10-CM | POA: Insufficient documentation

## 2015-04-07 DIAGNOSIS — I10 Essential (primary) hypertension: Secondary | ICD-10-CM | POA: Diagnosis not present

## 2015-04-07 DIAGNOSIS — R51 Headache: Secondary | ICD-10-CM | POA: Diagnosis not present

## 2015-04-07 DIAGNOSIS — Z3202 Encounter for pregnancy test, result negative: Secondary | ICD-10-CM | POA: Diagnosis not present

## 2015-04-07 LAB — URINALYSIS COMPLETE WITH MICROSCOPIC (ARMC ONLY)
BILIRUBIN URINE: NEGATIVE
Glucose, UA: NEGATIVE mg/dL
KETONES UR: NEGATIVE mg/dL
Leukocytes, UA: NEGATIVE
Nitrite: NEGATIVE
PH: 6 (ref 5.0–8.0)
Protein, ur: NEGATIVE mg/dL
Specific Gravity, Urine: 1.01 (ref 1.005–1.030)

## 2015-04-07 LAB — CBC
HEMATOCRIT: 40.7 % (ref 35.0–47.0)
Hemoglobin: 13.5 g/dL (ref 12.0–16.0)
MCH: 28.2 pg (ref 26.0–34.0)
MCHC: 33.1 g/dL (ref 32.0–36.0)
MCV: 85.1 fL (ref 80.0–100.0)
Platelets: 252 10*3/uL (ref 150–440)
RBC: 4.78 MIL/uL (ref 3.80–5.20)
RDW: 14.1 % (ref 11.5–14.5)
WBC: 6.4 10*3/uL (ref 3.6–11.0)

## 2015-04-07 LAB — BASIC METABOLIC PANEL
Anion gap: 9 (ref 5–15)
BUN: 15 mg/dL (ref 6–20)
CHLORIDE: 104 mmol/L (ref 101–111)
CO2: 28 mmol/L (ref 22–32)
CREATININE: 0.65 mg/dL (ref 0.44–1.00)
Calcium: 9.5 mg/dL (ref 8.9–10.3)
GFR calc Af Amer: >60 mL/min (ref >60)
GFR calc non Af Amer: >60 mL/min (ref >60)
GLUCOSE: 78 mg/dL (ref 65–99)
POTASSIUM: 3.7 mmol/L (ref 3.5–5.1)
Sodium: 141 mmol/L (ref 135–145)

## 2015-04-07 LAB — POCT PREGNANCY, URINE: Preg Test, Ur: NEGATIVE

## 2015-04-07 NOTE — ED Provider Notes (Signed)
[facility] Emergency Department Provider Note  ____________________________________________  Time seen: 22  I have reviewed the triage vital signs and the nursing notes.   HISTORY  Chief Complaint Numbness and Headache     HPI Jill Cruz is a 49 y.o. female who has been having tingling and numbness into her left arm for approximately 3-4 weeks. This began slowly and has been increasing in intensity. It now is physically focal in her left hand into the fourth and fifth digit. It does not seem to get worse when she moves, there is no relation to time of day. She does report some discomfort around her left shoulder. She denies any weakness in the hand.  She reports she has some tingling in her toes and also reports that over the past 4-5 weeks she has had a twitch in her left face. These symptoms are less noticeable for the patient and not the focus of her visit today, but she mentions it has part of her concern for the numbness in the left arm and hand.  She also describes a mild headache. She appears in no acute distress. She points to a small area in her forehead for this discomfort.  The patient called her primary physician this morning to arrange an appointment. Due to the numbness tingling in conjunction with the mentioned headache, the office directed her to the emergency department.  The patient recently had a dental implant done. She has been on ibuprofen for this. She reports she doesn't think this helps with the numbness or tingling.  Past Medical History  Diagnosis Date  . History of pre-eclampsia in prior pregnancy, currently pregnant   . PONV (postoperative nausea and vomiting)   . Hypertension     On Labetalol  . Anxiety 2011    Post Partum Anxiety/Depression treated w/ medication. Now resolved.  Marland Kitchen GERD (gastroesophageal reflux disease)     Mild baseline- worse w/pregnancy. Takes TUMS prn.  . Postpartum care following cesarean  delivery (03/27/13) 03/27/2013  . MTHFR mutation 03/27/2013  . S/P repeat low transverse C-section 03/30/2013    Patient Active Problem List   Diagnosis Date Noted  . Headache 12/14/2014  . HTN (hypertension) 05/16/2013  . S/P repeat low transverse C-section 03/30/2013    Past Surgical History  Procedure Laterality Date  . Cesarean section  2011    at [facility]  . Cholecystectomy  ~2003    [facility] in [place]  . Knee arthroscopy w/ acl reconstruction and patella graft Right 2000    [facility]. in [place]  . Diagnostic laparoscopy Left ~2008    Ovarian Cyst removed- [facility]. in [place]  . Cesarean section N/A 03/27/2013    Procedure: REPEAT CESAREAN SECTION;  Surgeon: Claiborne Billings A. Pamala Hurry, MD;  Location: [facility];  Service: Obstetrics;  Laterality: N/A;  Repeat C/S  EDD: 04/23/13  . Knee arthroscopy w/ partial medial meniscectomy Right 2000    Current Outpatient Rx  Name  Route  Sig  Dispense  Refill  . CVS B-12 1000 MCG/15ML LIQD            0   . folic acid (FOLVITE) 1 MG tablet      TAKE 4 TABLETS BY MOUTH EVERY DAY   120 tablet   11   . ibuprofen (ADVIL,MOTRIN) 600 MG tablet   Oral   Take 1 tablet by mouth daily as needed.         Marland Kitchen lisinopril (PRINIVIL,ZESTRIL) 10 MG tablet  Oral   Take 1 tablet (10 mg total) by mouth daily.   90 tablet   3   . Fluocinolone Acetonide 0.01 % OIL   Otic   Place 5 drops in ear(s) 2 (two) times daily.   1 Bottle   0     Allergies Codeine  Family History  Problem Relation Age of Onset  . Hypertension Mother   . Diabetes Mother   . Arthritis Mother   . Arthritis Father     Social History History  Substance Use Topics  . Smoking status: Never Smoker   . Smokeless tobacco: Never Used  . Alcohol Use: No    Review of Systems  Constitutional: Negative for fever. ENT: Negative for sore throat. Cardiovascular: Negative for chest pain. Respiratory: Negative for shortness of breath. Gastrointestinal: Negative for abdominal  pain, vomiting and diarrhea. Genitourinary: Negative for dysuria. Musculoskeletal: No myalgias or injuries. Skin: Negative for rash. Neurological: Paresthesias and numbness in the left hand. No weakness. See history of present illness  10-point ROS otherwise negative.  ____________________________________________   PHYSICAL EXAM:  VITAL SIGNS: ED Triage Vitals  Enc Vitals Group     BP 04/07/15 0848 157/102 mmHg     Pulse Rate 04/07/15 0848 76     Resp 04/07/15 0848 18     Temp --      Temp src --      SpO2 04/07/15 0848 100 %     Weight 04/07/15 0848 250 lb 14.1 oz (113.799 kg)     Height 04/07/15 0848 5\' 7"  (1.702 m)     Head Cir --      Peak Flow --      Pain Score 04/07/15 0849 5     Pain Loc --      Pain Edu? --      Excl. in [place]? --     Constitutional:  Alert and oriented. Well appearing and in no distress. ENT   Head: Normocephalic and atraumatic.   Nose: No congestion/rhinnorhea.   Mouth/Throat: Mucous membranes are moist. Cardiovascular: Normal rate, regular rhythm, no murmur noted Respiratory:  Normal respiratory effort, no tachypnea.    Breath sounds are clear and equal bilaterally.  Gastrointestinal: Soft and nontender. No distention.  Back: No muscle spasm, no tenderness, no CVA tenderness. Musculoskeletal: No deformity noted. Nontender with normal range of motion in all extremities.  No noted edema. There is minimal discomfort with palpation of the left trapezius and around the left scapula. Neurologic:  Normal speech and language. No gross focal neurologic deficits are appreciated.  Ambulatory. 5 or 5 strength in all 4 extremities, equal grip strength, good finger to nose coordination, negative pronator drift and negative Romberg, sensation intact in all 4 extremities including on the left hand. Intrinsic strength of both hands is normal. Skin:  Skin is warm, dry. No rash noted. Psychiatric: Mood and affect are normal. Speech and behavior are  normal.  ____________________________________________    LABS (pertinent positives/negatives)  Labs Reviewed  URINALYSIS COMPLETEWITH MICROSCOPIC ([facility] ONLY) - Abnormal; Notable for the following:    Color, Urine YELLOW (*)    APPearance CLEAR (*)    Hgb urine dipstick 2+ (*)    Bacteria, UA RARE (*)    Squamous Epithelial / LPF 0-5 (*)    All other components within normal limits  CBC  BASIC METABOLIC PANEL  POC URINE PREG, ED     ____________________________________________   EKG  ED ECG REPORT I, Ashlon Lottman W, the attending  physician, personally viewed and interpreted this ECG.   Date: 04/07/2015  EKG Time: 849  Rate: 72  Rhythm: Normal sinus rhythm  Axis: Normal  Intervals: Normal  ST&T Change: None noted   ____________________________________________ ____________________________________________   INITIAL IMPRESSION / ASSESSMENT AND PLAN / ED COURSE  Pertinent labs & imaging results that were available during my care of the patient were reviewed by me and considered in my medical decision making (see chart for details).  Pleasant alert 49 year old female in no acute distress and negative neurologic exam.  I suspect this patient's left arm neuropathy is due to some form of nerve impingement and is a peripheral neuropathy. The tingling in her toes or left face I think are quite minor and the patient agrees these are not very noticeable and part of more often inventory she has taken.  We have discussed the pros and cons of a head CT and the patient prefers not to have a head CT today. She will follow with her primary physician. She may benefit from an MRI of her cervical spine to assess for nerve impingement or herniated disc. There is no indication for an emergent MRI currently. Patient is in agreement with this plan.  ____________________________________________   FINAL CLINICAL IMPRESSION(S) / ED DIAGNOSES  Final diagnoses:  Cervical radiculopathy,  acute  Left arm     Ahmed Prima, MD 04/07/15 [phone]

## 2015-04-07 NOTE — Telephone Encounter (Signed)
This does not sound like a stroke and probably should have been triaged to office visit See if she has gone to ER, otherwise set her up in office today or tomorrow  Vincente Liberty, For your info

## 2015-04-07 NOTE — Telephone Encounter (Signed)
Lm on pts vm and advised that she is needing OV if not seen at ED. Advised pt to contact office and schedule if she has not been seen already

## 2015-04-07 NOTE — ED Notes (Signed)
Pt c/o intermittent tingling in left hand for the past week, states this morning she woke around 0615am with tingling/numbness to the left side of face, leg, arm with HA.Marland Kitchendenies blurred vision or other neuro sx.Marland Kitchen

## 2015-04-07 NOTE — Telephone Encounter (Signed)
PLEASE NOTE: All timestamps contained within this report are represented as [place] Standard Time. CONFIDENTIALTY NOTICE: This fax transmission is intended only for the addressee. It contains information that is legally privileged, confidential or otherwise protected from use or disclosure. If you are not the intended recipient, you are strictly prohibited from reviewing, disclosing, copying using or disseminating any of this information or taking any action in reliance on or regarding this information. If you have received this fax in error, please notify us immediately by telephone so that we can arrange for its return to [place]. Phone: [phone], Toll-Free: [phone], Fax: [phone] Page: 1 of 2 Call Id: [id number] [place] Patient Name: Jill Cruz Gender: Female DOB: 1965/11/24 Age: 49 Y 56 M 15 D Return Phone Number: [phone] (Primary) Address: City/State/Zip: [place] [address] Client [facility] - Client Client Site [place] - Day Physician Deborra Medina, Tanja Port Contact Type Call Call Type Triage / Clinical Relationship To Patient Self Appointment Disposition EMR Appointment Not Necessary Info pasted into Epic Yes Return Phone Number [phone] (Primary) Chief Complaint Numbness Initial Comment Caller states for the last month and half, started having muscle spasm in her face on the left side, every day it gets worse and having tingling in hands and feet. PreDisposition Did not know what to do Nurse Assessment Nurse: Vallery Sa, RN, [place] Date/Time (Eastern Time): 04/07/2015 8:17:37 AM Confirm and document reason for call. If symptomatic, describe symptoms. ---[name] has been having numbness in her hands and feet on and off the past 3 weeks that has become worse worse the past few days. She has mild pain and spasms on  the left side of her face. No fever. no injury in the past few days. Has the patient traveled out of the country within the last 30 days? ---No Does the patient require triage? ---Yes Related visit to physician within the last 2 weeks? ---No Does the PT have any chronic conditions? (i.e. diabetes, asthma, etc.) ---Yes List chronic conditions. ---High Blood Pressure Did the patient indicate they were pregnant? ---No Guidelines Guideline Title Affirmed Question Affirmed Notes Nurse Date/Time Eilene Ghazi Time) Neurologic Deficit Headache (and neurologic deficit) Vallery Sa, RN, [facility] 04/07/2015 8:19:47 AM Disp. Time Eilene Ghazi Time) Disposition Final User 04/07/2015 8:21:36 AM Go to ED Now Yes Vallery Sa, RN, [place] Understands: Yes PLEASE NOTE: All timestamps contained within this report are represented as [place] Standard Time. CONFIDENTIALTY NOTICE: This fax transmission is intended only for the addressee. It contains information that is legally privileged, confidential or otherwise protected from use or disclosure. If you are not the intended recipient, you are strictly prohibited from reviewing, disclosing, copying using or disseminating any of this information or taking any action in reliance on or regarding this information. If you have received this fax in error, please notify us immediately by telephone so that we can arrange for its return to [place]. Phone: [phone], Toll-Free: [phone], Fax: [phone] Page: 2 of 2 Call Id: [id number] Disagree/Comply: Comply Care Advice Given Per Guideline GO TO ED NOW: You need to be seen in the Emergency Department. Go to the ER at ___________ [place] now. Drive carefully. NOTE TO TRIAGER - DRIVING: * Another adult should drive. * If immediate transportation is not available via car or taxi, then the patient should be instructed to call [id number]. CARE ADVICE given per Neurologic Deficit (Adult) guideline. After Care Instructions  Given  Call Event Type User Date / Time Description Referrals [facility] - ED

## 2015-04-07 NOTE — Discharge Instructions (Signed)

## 2015-04-07 NOTE — Telephone Encounter (Signed)
Patient Name: Jill Cruz DOB: 16-Oct-1965 Initial Comment Caller states for the last month and half, started having muscle spasm in her face on the left side, every day it gets worse and having tingling in hands and feet. Nurse Assessment Nurse: Vallery Sa, RN, [place] Date/Time (Eastern Time): 04/07/2015 8:17:37 AM Confirm and document reason for call. If symptomatic, describe symptoms. ---[name] has been having numbness in her hands and feet on and off the past 3 weeks that has become worse worse the past few days. She has mild pain and spasms on the left side of her face. No fever. no injury in the past few days. Has the patient traveled out of the country within the last 30 days? ---No Does the patient require triage? ---Yes Related visit to physician within the last 2 weeks? ---No Does the PT have any chronic conditions? (i.e. diabetes, asthma, etc.) ---Yes List chronic conditions. ---High Blood Pressure Did the patient indicate they were pregnant? ---No Guidelines Guideline Title Affirmed Question Affirmed Notes Neurologic Deficit Headache (and neurologic deficit) Final Disposition User Go to ED Now Vallery Sa, RN, [place] She plans to go to [facility] ER.

## 2015-04-19 ENCOUNTER — Encounter: Payer: Self-pay | Admitting: Internal Medicine

## 2015-04-19 ENCOUNTER — Ambulatory Visit (INDEPENDENT_AMBULATORY_CARE_PROVIDER_SITE_OTHER): Payer: 59 | Admitting: Internal Medicine

## 2015-04-19 VITALS — BP 112/80 | HR 78 | Temp 98.0°F | Wt 249.2 lb

## 2015-04-19 DIAGNOSIS — J019 Acute sinusitis, unspecified: Secondary | ICD-10-CM | POA: Insufficient documentation

## 2015-04-19 DIAGNOSIS — J0100 Acute maxillary sinusitis, unspecified: Secondary | ICD-10-CM

## 2015-04-19 NOTE — Progress Notes (Signed)
Subjective:    Patient ID: Jill Cruz, female    DOB: Mar 01, 1966, 49 y.o.   MRN: [id number]  HPI Having a lot of sinus pressure Maxillary and frontal pain Started 2 days ago  Some nausea from PND--probably Feels tired Not much cough No fever--but felt flushed yesterday  Some sore throat--better now. Feels dry now No ear pain No SOB  Tried mucinex--no help No other meds  Current Outpatient Prescriptions on File Prior to Visit  Medication Sig Dispense Refill  . CVS B-12 1000 DUK/02RK LIQD   0  . folic acid (FOLVITE) 1 MG tablet TAKE 4 TABLETS BY MOUTH EVERY DAY 120 tablet 11  . lisinopril (PRINIVIL,ZESTRIL) 10 MG tablet Take 1 tablet (10 mg total) by mouth daily. 90 tablet 3   No current facility-administered medications on file prior to visit.    Allergies  Allergen Reactions  . Codeine Nausea And Vomiting    Past Medical History  Diagnosis Date  . History of pre-eclampsia in prior pregnancy, currently pregnant   . PONV (postoperative nausea and vomiting)   . Hypertension     On Labetalol  . Anxiety 2011    Post Partum Anxiety/Depression treated w/ medication. Now resolved.  Marland Kitchen GERD (gastroesophageal reflux disease)     Mild baseline- worse w/pregnancy. Takes TUMS prn.  . Postpartum care following cesarean delivery (03/27/13) 03/27/2013  . MTHFR mutation 03/27/2013  . S/P repeat low transverse C-section 03/30/2013    Past Surgical History  Procedure Laterality Date  . Cesarean section  2011    at [facility]  . Cholecystectomy  ~2003    [facility] in [place]  . Knee arthroscopy w/ acl reconstruction and patella graft Right 2000    [facility]. in [place]  . Diagnostic laparoscopy Left ~2008    Ovarian Cyst removed- [facility]. in [place]  . Cesarean section N/A 03/27/2013    Procedure: REPEAT CESAREAN SECTION;  Surgeon: Claiborne Billings A. Pamala Hurry, MD;  Location: [facility];  Service: Obstetrics;  Laterality: N/A;  Repeat C/S  EDD: 04/23/13  . Knee arthroscopy w/ partial medial  meniscectomy Right 2000    Family History  Problem Relation Age of Onset  . Hypertension Mother   . Diabetes Mother   . Arthritis Mother   . Arthritis Father     History   Social History  . Marital Status: Married    Spouse Name: N/A  . Number of Children: N/A  . Years of Education: N/A   Occupational History  . Not on file.   Social History Main Topics  . Smoking status: Never Smoker   . Smokeless tobacco: Never Used  . Alcohol Use: No  . Drug Use: No  . Sexual Activity: Yes   Other Topics Concern  . Not on file   Social History Narrative   Married.   One son, [name] (age 62).   [redacted] weeks pregnant- 11/2011.   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   Review of Systems  No rash No vomiting Had loose stools yesterday-- 5-6 No abdominal pain Able to eat--appetite is off     Objective:   Physical Exam  Constitutional: She appears well-developed and well-nourished. No distress.  HENT:  Mild maxillary and frontal tenderness Moderate nasal inflammation TMs normal Slight pharyngeal injection without tonsillar enlargement or exudate  Neck: Normal range of motion. Neck supple. No thyromegaly present.  Pulmonary/Chest: Effort normal and breath sounds normal. No respiratory distress. She has no wheezes. She has no rales.  Abdominal: Soft. There is no tenderness.  Lymphadenopathy:    She has no cervical adenopathy.          Assessment & Plan:

## 2015-04-19 NOTE — Progress Notes (Signed)
Pre visit review using our clinic review tool, if applicable. No additional management support is needed unless otherwise documented below in the visit note. 

## 2015-04-19 NOTE — Assessment & Plan Note (Signed)
Probably viral since only 2 days and GI symptoms as well (?adenovirus) Discussed symptomatic Rx --- analgesics and can try decongestant If worse next week, may need empiric trial with amoxil

## 2015-06-09 ENCOUNTER — Encounter: Payer: Self-pay | Admitting: Family Medicine

## 2015-06-09 ENCOUNTER — Ambulatory Visit (INDEPENDENT_AMBULATORY_CARE_PROVIDER_SITE_OTHER): Payer: 59 | Admitting: Family Medicine

## 2015-06-09 VITALS — BP 134/90 | HR 63 | Temp 98.1°F | Wt 244.8 lb

## 2015-06-09 DIAGNOSIS — M2550 Pain in unspecified joint: Secondary | ICD-10-CM | POA: Diagnosis not present

## 2015-06-09 DIAGNOSIS — Z23 Encounter for immunization: Secondary | ICD-10-CM | POA: Diagnosis not present

## 2015-06-09 DIAGNOSIS — R5383 Other fatigue: Secondary | ICD-10-CM | POA: Insufficient documentation

## 2015-06-09 LAB — COMPREHENSIVE METABOLIC PANEL
ALT: 16 U/L (ref 0–35)
AST: 16 U/L (ref 0–37)
Albumin: 4.3 g/dL (ref 3.5–5.2)
Alkaline Phosphatase: 60 U/L (ref 39–117)
BUN: 12 mg/dL (ref 6–23)
CALCIUM: 9.8 mg/dL (ref 8.4–10.5)
CO2: 30 meq/L (ref 19–32)
Chloride: 100 mEq/L (ref 96–112)
Creatinine, Ser: 0.68 mg/dL (ref 0.40–1.20)
GFR: 97.83 mL/min (ref >60.00)
Glucose, Bld: 84 mg/dL (ref 70–99)
Potassium: 3.8 mEq/L (ref 3.5–5.1)
Sodium: 136 mEq/L (ref 135–145)
Total Bilirubin: 0.4 mg/dL (ref 0.2–1.2)
Total Protein: 7.4 g/dL (ref 6.0–8.3)

## 2015-06-09 LAB — RHEUMATOID FACTOR: Rhuematoid fact SerPl-aCnc: <10 IU/mL (ref <=14)

## 2015-06-09 LAB — VITAMIN B12: VITAMIN B 12: 589 pg/mL (ref 211–911)

## 2015-06-09 LAB — SEDIMENTATION RATE: SED RATE: 16 mm/h (ref 0–22)

## 2015-06-09 LAB — VITAMIN D 25 HYDROXY (VIT D DEFICIENCY, FRACTURES): VITD: 18.14 ng/mL — ABNORMAL LOW (ref 30.00–100.00)

## 2015-06-09 NOTE — Progress Notes (Signed)
Subjective:   Patient ID: Jill Cruz, female    DOB: 16-Feb-1966, 49 y.o.   MRN: [id number]  Jill Cruz is a pleasant 49 y.o. year old female who presents to clinic today with Fatigue and Arthritis  on 06/09/2015  HPI:  Several months of progressive fatigue, myalgias and arthralgias.  Started with right knee pain, now both knees, feet, shoulders hurt and body "aches."  Often difficulty sleeping at night because of the aches and pains which is worsening her fatigue. Has not really tried any OTC rxs other than occasional NSAIDs.  No SOB or CP but constantly tired.  OA runs in her family but is unaware of RA or other rheum diseases.  Denies any recent tick bites that she is aware of.  She has been more tearful lately.  Denies SI or HI.  Really enjoys being with her two boys.  No results found for: TSH Lab Results  Component Value Date   WBC 6.4 04/07/2015   HGB 13.5 04/07/2015   HCT 40.7 04/07/2015   MCV 85.1 04/07/2015   PLT 252 04/07/2015   Lab Results  Component Value Date   NA 141 04/07/2015   K 3.7 04/07/2015   CL 104 04/07/2015   CO2 28 04/07/2015   Lab Results  Component Value Date   ALT 68* 03/29/2013   AST 48* 03/29/2013   ALKPHOS 82 03/29/2013   BILITOT 0.3 03/29/2013   Current Outpatient Prescriptions on File Prior to Visit  Medication Sig Dispense Refill  . [facility] B-12 1000 [id number] LIQD   0  . folic acid (FOLVITE) 1 MG tablet TAKE 4 TABLETS BY MOUTH EVERY DAY 120 tablet 11  . lisinopril (PRINIVIL,ZESTRIL) 10 MG tablet Take 1 tablet (10 mg total) by mouth daily. 90 tablet 3   No current facility-administered medications on file prior to visit.    Allergies  Allergen Reactions  . Codeine Nausea And Vomiting    Past Medical History  Diagnosis Date  . History of pre-eclampsia in prior pregnancy, currently pregnant   . PONV (postoperative nausea and vomiting)   . Hypertension     On Labetalol  . Anxiety 2011    Post Partum  Anxiety/Depression treated w/ medication. Now resolved.  Marland Kitchen GERD (gastroesophageal reflux disease)     Mild baseline- worse w/pregnancy. Takes TUMS prn.  . Postpartum care following cesarean delivery (03/27/13) 03/27/2013  . MTHFR mutation 03/27/2013  . S/P repeat low transverse C-section 03/30/2013    Past Surgical History  Procedure Laterality Date  . Ces[facility] section  2011    at [facility]  . Cholecystectomy  ~2003    [facility] in [place]  . Knee arthroscopy w/ acl reconstruction and patella graft Right 2000    [facility]. in [place]  . Diagnostic laparoscopy Left ~2008    Ovarian Cyst removed- [facility]. in [place]  . Cesarean section N/A 03/27/2013    Procedure: REPEAT CESAREAN SECTION;  Surgeon: Claiborne Billings A. Pamala Hurry, MD;  Location: [facility];  Service: Obstetrics;  Laterality: N/A;  Repeat C/S  EDD: 04/23/13  . Knee arthroscopy w/ partial medial meniscectomy Right 2000    Family History  Problem Relation Age of Onset  . Hypertension Mother   . Diabetes Mother   . Arthritis Mother   . Arthritis Father     Social History   Social History  . Marital Status: Married    Spouse Name: N/A  . Number of Children: N/A  . Years of Education: N/A  Occupational History  . Not on file.   Social History Main Topics  . Smoking status: Never Smoker   . Smokeless tobacco: Never Used  . Alcohol Use: No  . Drug Use: No  . Sexual Activity: Yes   Other Topics Concern  . Not on file   Social History Narrative   Married.   One son, [name] (age 62).   [redacted] weeks pregnant- 11/2011.   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   The PMH, PSH, Social History, Family History, Medications, and allergies have been reviewed in [facility], and have been updated if relevant.   Review of Systems  Constitutional: Positive for fatigue. Negative for fever and unexpected weight change.  HENT: Negative.   Eyes: Negative.   Respiratory: Negative.   Cardiovascular: Negative.   Gastrointestinal: Negative.   Endocrine: Negative.     Genitourinary: Negative.   Musculoskeletal: Positive for myalgias, back pain and arthralgias. Negative for joint swelling.  Skin: Negative.   Allergic/Immunologic: Negative.   Neurological: Negative.   Hematological: Negative.   Psychiatric/Behavioral: Positive for sleep disturbance and dysphoric mood. Negative for suicidal ideas, hallucinations, behavioral problems, confusion, self-injury and agitation. The patient is nervous/anxious. The patient is not hyperactive.   All other systems reviewed and are negative.      Objective:    BP 134/90 mmHg  Pulse 63  Temp(Src) 98.1 F (36.7 C) (Oral)  Wt 244 lb 12 oz (111.018 kg)  SpO2 99%   Physical Exam  Constitutional: She is oriented to person, place, and time. She appears well-developed and well-nourished. No distress.  HENT:  Head: Normocephalic.  Eyes: Conjunctivae are normal.  Neck: Normal range of motion.  Cardiovascular: Normal rate and regular rhythm.   Musculoskeletal: Normal range of motion. She exhibits no edema.  Neurological: She is alert and oriented to person, place, and time. No cranial nerve deficit.  Skin: Skin is dry.  Psychiatric:  Tearful, appropriate  Nursing note and vitals reviewed.         Assessment & Plan:   Arthralgia  Other fatigue No Follow-up on file.

## 2015-06-09 NOTE — Patient Instructions (Signed)
Great to see you. I will call you with your lab results.  The medication we discussed today is Cymbalta.

## 2015-06-09 NOTE — Progress Notes (Signed)
Pre visit review using our clinic review tool, if applicable. No additional management support is needed unless otherwise documented below in the visit note. 

## 2015-06-09 NOTE — Assessment & Plan Note (Signed)
>  25 minutes spent in face to face time with patient, >50% spent in counselling or coordination of care discussing fatigue, arthralgias and tearfulness.  Likely multifactorial. Rule out rheum and other issues such as tick borne illness with labs today. ? Component of depression but she feels she is sad because she is in pain.  Wants to await lab results before considering tx for depression- ? cymbalta may be a good choice for her. The patient indicates understanding of these issues and agrees with the plan. Orders Placed This Encounter  Procedures  . Flu Vaccine QUAD 36+ mos PF IM (Fluarix & Fluzone Quad PF)  . ANA  . Sedimentation Rate  . Rheumatoid Factor  . Comprehensive metabolic panel  . Cyclic Citrul Peptide Antibody, IGG  . B. Burgdorfi Antibodies  . Vitamin B12  . Vitamin D, 25-hydroxy  . Rocky mtn spotted fvr abs pnl(IgG+IgM)

## 2015-06-10 LAB — ANA: Anti Nuclear Antibody(ANA): NEGATIVE

## 2015-06-10 LAB — ROCKY MTN SPOTTED FVR ABS PNL(IGG+IGM)
RMSF IGG: 0.12 IV
RMSF IgM: 0.74 IV

## 2015-06-10 LAB — B. BURGDORFI ANTIBODIES: B burgdorferi Ab IgG+IgM: 0.45 {ISR}

## 2015-06-10 LAB — CYCLIC CITRUL PEPTIDE ANTIBODY, IGG: Cyclic Citrullin Peptide Ab: 4.2 U/mL (ref 0.0–5.0)

## 2015-06-14 ENCOUNTER — Telehealth: Payer: Self-pay

## 2015-06-14 MED ORDER — VITAMIN D3 1.25 MG (50000 UT) PO TABS: 1.0000 | ORAL_TABLET | ORAL | Status: DC

## 2015-06-14 NOTE — Telephone Encounter (Signed)
-----   Message from Lucille Passy, MD sent at 06/11/2015  8:04 AM EDT ----- Please call pt- all of her labs are reassuring.  No indication of tick borne illness or rheumatological disease but her Vit D is very low which can cause fatigue and muscle aches. Please send in rx for Vit D3 50,000 units x 6 weeks - 1 tab po weekly x 6 weeks (number 6, no refills), then continue VitD 1600 IU daily.  Recheck in 8-12 weeks (268.0)   Keep me updated with her symptoms.

## 2015-06-14 NOTE — Telephone Encounter (Signed)
Notified patient and Rx sent into pharmacy as directed.

## 2015-06-17 ENCOUNTER — Ambulatory Visit
Admission: RE | Admit: 2015-06-17 | Discharge: 2015-06-17 | Disposition: A | Discharge status: Home / Self Care | Payer: 59 | Source: Ambulatory Visit

## 2015-06-17 ENCOUNTER — Ambulatory Visit (INDEPENDENT_AMBULATORY_CARE_PROVIDER_SITE_OTHER)
Admission: RE | Admit: 2015-06-17 | Discharge: 2015-06-17 | Disposition: A | Discharge status: Home / Self Care | Payer: 59 | Source: Ambulatory Visit | Attending: Family Medicine | Admitting: Family Medicine

## 2015-06-17 ENCOUNTER — Encounter: Payer: Self-pay | Admitting: Family Medicine

## 2015-06-17 ENCOUNTER — Ambulatory Visit (INDEPENDENT_AMBULATORY_CARE_PROVIDER_SITE_OTHER): Payer: 59 | Admitting: Family Medicine

## 2015-06-17 VITALS — BP 126/89 | HR 59 | Temp 98.5°F | Ht 67.0 in | Wt 244.0 lb

## 2015-06-17 DIAGNOSIS — M25562 Pain in left knee: Secondary | ICD-10-CM

## 2015-06-17 DIAGNOSIS — M2392 Unspecified internal derangement of left knee: Secondary | ICD-10-CM | POA: Diagnosis not present

## 2015-06-17 DIAGNOSIS — M1711 Unilateral primary osteoarthritis, right knee: Secondary | ICD-10-CM | POA: Diagnosis not present

## 2015-06-17 DIAGNOSIS — M25561 Pain in right knee: Secondary | ICD-10-CM

## 2015-06-17 DIAGNOSIS — M7741 Metatarsalgia, right foot: Secondary | ICD-10-CM

## 2015-06-17 NOTE — Progress Notes (Signed)
Pre visit review using our clinic review tool, if applicable. No additional management support is needed unless otherwise documented below in the visit note. 

## 2015-06-17 NOTE — Progress Notes (Signed)
Dr. Frederico Hamman T. Savahna Casados, MD, [facility] [place], [address] Phone: [phone] Fax: [phone]  06/17/2015  Patient: Jill Cruz, MRN: [id number], DOB: Feb 09, 1966, 49 y.o.  Primary Physician:  Arnette Norris, MD  Chief Complaint: Knee Pain and Foot Pain  Subjective:   Jill Cruz is a 49 y.o. very pleasant female patient who presents with the following:  Multiple complaints.   R knee has always been bothering her, and it is still bothering her. Limping and pain in her foot. Bouncing in the bouncy house with her 49 year old. She has a history of BTB ACL reconstruction approximately 15 years ago, and prior to this she has a history of partial meniscectomy done arthroscopically.  She has been having intermittent knee pain on the right for a number of years.  NSAIDs and Tylenol help somewhat.  L knee. When turing it will her cause her some specific pain.  2 or 3 months ago, the patient was in a bouncy house with her children and then felt some immediate pain in her knee.  Since then she has been having pain intermittently when she rotates her knee and forcefully flexes her knee.  It is not a constant pain but is apparent with movement and particularly rotational movements.  No current effusion.  No mechanical symptoms, no locking up and no giving way. Bouncy house  For the past few months, the patient also has been having some significant forefoot pain, primarily around the second and third metatarsal heads.  She doesn't baseline have a quite wide foot with some significant transverse arch collapse.  Pes cavus naturally.  Past Medical History, Surgical History, Social History, Family History, Problem List, Medications, and Allergies have been reviewed and updated if relevant.  Patient Active Problem List   Diagnosis Date Noted  . Arthralgia 06/09/2015  . Fatigue 06/09/2015  . HTN (hypertension) 05/16/2013  . S/P  repeat low transverse C-section 03/30/2013    Past Medical History  Diagnosis Date  . History of pre-eclampsia in prior pregnancy, currently pregnant   . PONV (postoperative nausea and vomiting)   . Hypertension     On Labetalol  . Anxiety 2011    Post Partum Anxiety/Depression treated w/ medication. Now resolved.  Marland Kitchen GERD (gastroesophageal reflux disease)     Mild baseline- worse w/pregnancy. Takes TUMS prn.  . Postpartum care following cesarean delivery (03/27/13) 03/27/2013  . MTHFR mutation 03/27/2013  . S/P repeat low transverse C-section 03/30/2013    Past Surgical History  Procedure Laterality Date  . Cesarean section  2011    at [facility]  . Cholecystectomy  ~2003    [facility] in [place]  . Knee arthroscopy w/ acl reconstruction and patella graft Right 2000    [facility]. in [place]  . Diagnostic laparoscopy Left ~2008    Ovarian Cyst removed- [facility]. in [place]  . Cesarean section N/A 03/27/2013    Procedure: REPEAT CESAREAN SECTION;  Surgeon: Claiborne Billings A. Pamala Hurry, MD;  Location: [facility];  Service: Obstetrics;  Laterality: N/A;  Repeat C/S  EDD: 04/23/13  . Knee arthroscopy w/ partial medial meniscectomy Right 2000    Social History   Social History  . Marital Status: Married    Spouse Name: N/A  . Number of Children: N/A  . Years of Education: N/A   Occupational History  . Not on file.   Social History Main Topics  . Smoking status: Never Smoker   .  Smokeless tobacco: Never Used  . Alcohol Use: No  . Drug Use: No  . Sexual Activity: Yes   Other Topics Concern  . Not on file   Social History Narrative   Married.   One son, [name] (age 62).   [redacted] weeks pregnant- 11/2011.   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    Family History  Problem Relation Age of Onset  . Hypertension Mother   . Diabetes Mother   . Arthritis Mother   . Arthritis Father     Allergies  Allergen Reactions  . Codeine Nausea And Vomiting    Medication list reviewed and updated in full in [facility].  GEN: No fevers, chills. Nontoxic. Primarily MSK c/o today. MSK: Detailed in the HPI GI: tolerating PO intake without difficulty Neuro: No numbness, parasthesias, or tingling associated. Otherwise the pertinent positives of the ROS are noted above.   Objective:   BP 126/89 mmHg  Pulse 59  Temp(Src) 98.5 F (36.9 C) (Oral)  Ht 5\' 7"  (1.702 m)  Wt 244 lb (110.678 kg)  BMI 38.21 kg/m2   GEN: WDWN, NAD, Non-toxic, Alert & Oriented x 3 HEENT: Atraumatic, Normocephalic.  Ears and Nose: No external deformity. EXTR: No clubbing/cyanosis/edema NEURO: Normal gait.  PSYCH: Normally interactive. Conversant. Not depressed or anxious appearing.  Calm demeanor.   Knee:  B Gait: Normal heel toe pattern ROM: 0-120 on R, 0-115 on the L Effusion: neg Echymosis or edema: none Patellar tendon NT Painful PLICA: neg Patellar grind: negative Medial and lateral patellar facet loading: negative medial and lateral joint lines: TTP medially on the R, TTP medially and laterally on the L Mcmurray's + for pain on the L Flexion-pinch + on L Varus and valgus stress: stable Lachman: neg Ant and Post drawer: neg Hip abduction, IR, ER: WNL Hip flexion str: 5/5 Hip abd: 5/5 Quad: 5/5 VMO atrophy:No Hamstring concentric and eccentric: 5/5   FEET: R Echymosis: no Edema: no ROM: full LE B Gait: heel toe, non-antalgic MT pain: yes with squeeze and between 1-2 and 2-3 near heads Callus pattern: none Lateral Mall: NT Medial Mall: NT Talus: NT Navicular: NT Cuboid: NT Calcaneous: NT Metatarsals: NT 5th MT: NT Phalanges: NT Achilles: NT Plantar Fascia: NT Fat Pad: NT Peroneals: NT Post Tib: NT Great Toe: Nml motion Ant Drawer: neg ATFL: NT CFL: NT Deltoid: NT Other foot breakdown: none Long arch: preserved Transverse arch: bunion and bunionette formation with dropped MT heads throughout Hindfoot breakdown: none Sensation: intact   Radiology: Dg Knee Ap/lat W/sunrise  Left  06/18/2015   CLINICAL DATA:  Chronic pain  EXAM: LEFT KNEE 3 VIEWS  COMPARISON:  None.  FINDINGS: Standing frontal, standing tunnel, standing lateral, and sunrise patellar images obtained. There is no fracture or dislocation. No appreciable joint effusion. Joint spaces appear intact. There is a spur arising from the anterior superior patella.  IMPRESSION: Spur arising from the anterior superior patella. No appreciable joint space narrowing. No fracture or effusion.   Electronically Signed   By: Lowella Grip III M.D.   On: 06/18/2015 09:16   Dg Knee Ap/lat W/sunrise Right  06/18/2015   CLINICAL DATA:  Bilateral knee pain after jumping in bouncy house with child. History of ACL repair.  EXAM: RIGHT KNEE 3 VIEWS  COMPARISON:  None.  FINDINGS: Changes of prior ACL repair. Moderate degenerative changes in all 3 compartments with joint space narrowing and spurring. No fracture. No joint effusion. Soft tissues are intact.  IMPRESSION:  Prior ACL repair with moderate tricompartment degenerative changes. No acute findings.   Electronically Signed   By: Rolm Baptise M.D.   On: 06/18/2015 09:15     Assessment and Plan:   Bilateral knee pain - Plan: DG Knee AP/LAT W/Sunrise Right, DG Knee AP/LAT W/Sunrise Left, MR Knee Left  Wo Contrast  Derangement, knee internal, left  Metatarsalgia of right foot  Primary osteoarthritis of right knee  Right-sided moderate tricompartmental arthritis status post BTB ACL reconstruction and previous partial meniscectomy.  Continue with conservative management, NSAIDs, ice, Tylenol as needed, and encourage weight loss.  Reassure regarding metatarsalgia and probable neuroma formation in the forefoot, probably exacerbated with recent knee pain.  Placed a metatarsal pad in the patient's shoe, and I would address the primary knee issues first.  Left-sided knee pain since initial incident in bouncy house.  Probable injury and internal derangement with positive McMurray's  test for pain, positive bounce home testing, and joint line pain in her knee with minimal osteoarthritic changes on plain films.  Failure of conservative management for 2-3 months.  Obtain an MRI of the left knee to evaluate for primary concern of meniscal pathology.  New Prescriptions   No medications on file   Orders Placed This Encounter  Procedures  . DG Knee AP/LAT W/Sunrise Right  . DG Knee AP/LAT W/Sunrise Left  . MR Knee Left  Wo Contrast    Signed,  Helga Asbury T. Thorn Demas, MD   Patient's Medications  New Prescriptions   No medications on file  Previous Medications   CHOLECALCIFEROL (VITAMIN D3) 50000 UNITS TABS    Take 1 tablet by mouth every 7 (seven) days.   [facility]S B-12 1000 PIR/51OA LIQD       FOLIC ACID (FOLVITE) 1 MG TABLET    TAKE 4 TABLETS BY MOUTH EVERY DAY   LISINOPRIL (PRINIVIL,ZESTRIL) 10 MG TABLET    Take 1 tablet (10 mg total) by mouth daily.  Modified Medications   No medications on file  Discontinued Medications   No medications on file

## 2015-07-13 ENCOUNTER — Ambulatory Visit
Admission: RE | Admit: 2015-07-13 | Discharge: 2015-07-13 | Disposition: A | Discharge status: Home / Self Care | Payer: 59 | Source: Ambulatory Visit | Attending: Family Medicine | Admitting: Family Medicine

## 2015-07-13 DIAGNOSIS — M25462 Effusion, left knee: Secondary | ICD-10-CM | POA: Diagnosis not present

## 2015-07-13 DIAGNOSIS — X58XXXA Exposure to other specified factors, initial encounter: Secondary | ICD-10-CM | POA: Diagnosis not present

## 2015-07-13 DIAGNOSIS — M2392 Unspecified internal derangement of left knee: Secondary | ICD-10-CM

## 2015-07-13 DIAGNOSIS — S83242A Other tear of medial meniscus, current injury, left knee, initial encounter: Secondary | ICD-10-CM | POA: Diagnosis not present

## 2015-07-15 ENCOUNTER — Encounter: Payer: Self-pay | Admitting: Family Medicine

## 2015-07-15 ENCOUNTER — Other Ambulatory Visit: Payer: Self-pay | Admitting: Family Medicine

## 2015-07-15 DIAGNOSIS — S83207A Unspecified tear of unspecified meniscus, current injury, left knee, initial encounter: Secondary | ICD-10-CM

## 2015-07-15 DIAGNOSIS — M25562 Pain in left knee: Secondary | ICD-10-CM

## 2015-11-30 ENCOUNTER — Ambulatory Visit (INDEPENDENT_AMBULATORY_CARE_PROVIDER_SITE_OTHER): Payer: 59 | Admitting: Family Medicine

## 2015-11-30 ENCOUNTER — Encounter: Payer: Self-pay | Admitting: Family Medicine

## 2015-11-30 VITALS — BP 134/78 | HR 69 | Temp 98.3°F | Wt 251.2 lb

## 2015-11-30 DIAGNOSIS — R5383 Other fatigue: Secondary | ICD-10-CM

## 2015-11-30 DIAGNOSIS — E559 Vitamin D deficiency, unspecified: Secondary | ICD-10-CM | POA: Insufficient documentation

## 2015-11-30 LAB — CBC WITH DIFFERENTIAL/PLATELET
BASOS ABS: 0 10*3/uL (ref 0.0–0.1)
Basophils Relative: 0.3 % (ref 0.0–3.0)
EOS ABS: 0.2 10*3/uL (ref 0.0–0.7)
EOS PCT: 2.4 % (ref 0.0–5.0)
HCT: 40.9 % (ref 36.0–46.0)
Hemoglobin: 13.8 g/dL (ref 12.0–15.0)
LYMPHS ABS: 3.2 10*3/uL (ref 0.7–4.0)
Lymphocytes Relative: 37.3 % (ref 12.0–46.0)
MCHC: 33.7 g/dL (ref 30.0–36.0)
MCV: 85.9 fl (ref 78.0–100.0)
Monocytes Absolute: 0.7 10*3/uL (ref 0.1–1.0)
Monocytes Relative: 8 % (ref 3.0–12.0)
NEUTROS PCT: 52 % (ref 43.0–77.0)
Neutro Abs: 4.5 10*3/uL (ref 1.4–7.7)
PLATELETS: 290 10*3/uL (ref 150.0–400.0)
RBC: 4.76 Mil/uL (ref 3.87–5.11)
RDW: 13.5 % (ref 11.5–15.5)
WBC: 8.7 10*3/uL (ref 4.0–10.5)

## 2015-11-30 LAB — VITAMIN B12: VITAMIN B 12: 561 pg/mL (ref 211–911)

## 2015-11-30 LAB — VITAMIN D 25 HYDROXY (VIT D DEFICIENCY, FRACTURES): VITD: 10.45 ng/mL — ABNORMAL LOW (ref 30.00–100.00)

## 2015-11-30 LAB — TSH: TSH: 0.64 u[IU]/mL (ref 0.35–4.50)

## 2015-11-30 NOTE — Progress Notes (Signed)
Subjective:   Patient ID: Jill Cruz, female    DOB: [birth year], 50 y.o.   MRN: [id number]  Jill Cruz is a pleasant 50 y.o. 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  Vit D was 18.4 in 06/2015, given course of high dose Vit D for 6 weeks and advised to continue taking 1600 IU daily after high dose completed.  She did not return for follow up labs. At that OV, we also check B12, rheum labs, RMSF titers which were all normal.  Symptoms did improve after she took vitamin D initially.  Constantly tired.  Sleeping "ok."  Does feel like her muscles have been more achy.  Her GYN is working on her with weight loss.  She saw her this morning and was prescribed a "weight loss pill" but she is not sure what the name of it is.   Current Outpatient Prescriptions on File Prior to Visit  Medication Sig Dispense Refill  . CVS B-12 1000 [id number] LIQD   0  . folic acid (FOLVITE) 1 MG tablet TAKE 4 TABLETS BY MOUTH EVERY DAY 120 tablet 11  . lisinopril (PRINIVIL,ZESTRIL) 10 MG tablet Take 1 tablet (10 mg total) by mouth daily. 90 tablet 3   No current facility-administered medications on file prior to visit.    Allergies  Allergen Reactions  . Codeine Nausea And Vomiting    Past Medical History  Diagnosis Date  . History of pre-eclampsia in prior pregnancy, currently pregnant   . PONV (postoperative nausea and vomiting)   . Hypertension     On Labetalol  . Anxiety 2011    Post Partum Anxiety/Depression treated w/ medication. Now resolved.  Marland Kitchen GERD (gastroesophageal reflux disease)     Mild baseline- worse w/pregnancy. Takes TUMS prn.  . Postpartum care following cesarean delivery (03/27/13) 03/27/2013  . MTHFR mutation ([place]) 03/27/2013  . S/P repeat low transverse C-section 03/30/2013    Past Surgical History  Procedure Laterality Date  . Cesarean section  2011    at [facility]  .  Cholecystectomy  ~2003    [facility] in [place]  . Knee arthroscopy w/ acl reconstruction and patella graft Right 2000    [facility]. in [place]  . Diagnostic laparoscopy Left ~2008    Ovarian Cyst removed- [facility]. in [place]  . Cesarean section N/A 03/27/2013    Procedure: REPEAT CESAREAN SECTION;  Surgeon: Claiborne Billings A. Pamala Hurry, MD;  Location: [facility];  Service: Obstetrics;  Laterality: N/A;  Repeat C/S  EDD: 04/23/13  . Knee arthroscopy w/ partial medial meniscectomy Right 2000    Family History  Problem Relation Age of Onset  . Hypertension Mother   . Diabetes Mother   . Arthritis Mother   . Arthritis Father     Social History   Social History  . Marital Status: Married    Spouse Name: N/A  . Number of Children: N/A  . Years of Education: N/A   Occupational History  . Not on file.   Social History Main Topics  . Smoking status: Never Smoker   . Smokeless tobacco: Never Used  . Alcohol Use: No  . Drug Use: No  . Sexual Activity: Yes   Other Topics Concern  . Not on file   Social History Narrative   Married.   One son, [name] (age 36).   [redacted] weeks pregnant- 11/2011.  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   The PMH, PSH, Social History, Family History, Medications, and allergies have been reviewed in [facility], and have been updated if relevant.  Review of Systems  Constitutional: Positive for fatigue.  HENT: Negative.   Eyes: Negative.   Respiratory: Negative.   Cardiovascular: Negative.   Gastrointestinal: Negative.   Endocrine: Negative.   Musculoskeletal: Positive for myalgias.  Allergic/Immunologic: Negative.   Neurological: Negative.   Psychiatric/Behavioral: Negative.   All other systems reviewed and are negative.      Objective:    BP 134/78 mmHg  Pulse 69  Temp(Src) 98.3 F (36.8 C) (Oral)  Wt 251 lb 4 oz (113.966 kg)  SpO2 98%   Physical Exam  Constitutional: She is oriented to person, place, and time. No distress.  obese  HENT:  Head: Normocephalic.    Eyes: Conjunctivae are normal.  Cardiovascular: Normal rate.   Pulmonary/Chest: Effort normal.  Musculoskeletal: Normal range of motion.  Neurological: She is alert and oriented to person, place, and time. No cranial nerve deficit.  Skin: Skin is warm and dry.  Psychiatric: She has a normal mood and affect. Her behavior is normal. Judgment and thought content normal.          Assessment & Plan:   Other fatigue No Follow-up on file.

## 2015-11-30 NOTE — Assessment & Plan Note (Signed)
I suspect Vit D deficiency. Labs today as part of initial work up. Discussed importance of taking maintenance OTC vit D daily. The patient indicates understanding of these issues and agrees with the plan.  Orders Placed This Encounter  Procedures  . Vitamin B12  . VITAMIN D 25 Hydroxy (Vit-D Deficiency, Fractures)  . TSH  . CBC with Differential/Platelet

## 2015-12-02 MED ORDER — VITAMIN D (ERGOCALCIFEROL) 1.25 MG (50000 UNIT) PO CAPS: 50000.0000 [IU] | ORAL_CAPSULE | ORAL | Status: DC

## 2015-12-02 NOTE — Addendum Note (Signed)
Addended by: Modena Nunnery on: 12/02/2015 08:11 AM   Modules accepted: Orders

## 2015-12-06 ENCOUNTER — Ambulatory Visit: Payer: 59 | Admitting: Family Medicine

## 2015-12-16 ENCOUNTER — Encounter: Payer: Self-pay | Admitting: Family Medicine

## 2015-12-16 ENCOUNTER — Ambulatory Visit (INDEPENDENT_AMBULATORY_CARE_PROVIDER_SITE_OTHER): Payer: 59 | Admitting: Family Medicine

## 2015-12-16 VITALS — BP 118/90 | HR 80 | Temp 98.5°F | Ht 67.0 in | Wt 247.8 lb

## 2015-12-16 DIAGNOSIS — M7712 Lateral epicondylitis, left elbow: Secondary | ICD-10-CM

## 2015-12-16 NOTE — Progress Notes (Signed)
Pre visit review using our clinic review tool, if applicable. No additional management support is needed unless otherwise documented below in the visit note. 

## 2015-12-16 NOTE — Progress Notes (Signed)
Dr. Frederico Hamman T. Kyandre Okray, MD, [facility] [place], [address] Phone: [phone] Fax: [phone]  12/16/2015  Patient: Jill Cruz, MRN: [id number], DOB: 06/18/1966, 50 y.o.  Primary Physician:  Arnette Norris, MD   Chief Complaint  Patient presents with  . Elbow Pain    Left  . Arm Pain    Left   Subjective:   Jill Cruz presents with lateral elbow pain.  Length of symptoms: almost 1 mo Hand effected: L  Patient describes a dull ache on the lateral elbow. There is some translation in the proximal forearm and in the distal upper arm. It is painful to lift with the hand facing down and to lift with the thumb in an upright position. Supination is painful. Patient points to the lateral epicondyle as the point of maximal tenderness near ECRB.  Was having some pain in the left shoulder and pain at the LE on the left. Can feel all the way down to her arms.   Doing some elliptical exercise now.   No trauma.   No prior fractures or operative interventions in the effective hand. Prior PT or HEP: none  Denies numbness or tingling. No significant neck or shoulder pain.  Also some mild trap pain / fatigue  The PMH, PSH, Social History, Family History, Medications, and allergies have been reviewed in [facility], and have been updated if relevant.  Patient Active Problem List   Diagnosis Date Noted  . Vitamin D deficiency 11/30/2015  . Arthralgia 06/09/2015  . Fatigue 06/09/2015  . HTN (hypertension) 05/16/2013  . S/P repeat low transverse C-section 03/30/2013    Past Medical History  Diagnosis Date  . History of pre-eclampsia in prior pregnancy, currently pregnant   . PONV (postoperative nausea and vomiting)   . Hypertension     On Labetalol  . Anxiety 2011    Post Partum Anxiety/Depression treated w/ medication. Now resolved.  Marland Kitchen GERD (gastroesophageal reflux disease)     Mild baseline- worse  w/pregnancy. Takes TUMS prn.  . Postpartum care following cesarean delivery (03/27/13) 03/27/2013  . MTHFR mutation ([place]) 03/27/2013  . S/P repeat low transverse C-section 03/30/2013    Past Surgical History  Procedure Laterality Date  . Cesarean section  2011    at [facility]  . Cholecystectomy  ~2003    [facility] in [place]  . Knee arthroscopy w/ acl reconstruction and patella graft Right 2000    [facility]. in [place]  . Diagnostic laparoscopy Left ~2008    Ovarian Cyst removed- [facility]. in [place]  . Cesarean section N/A 03/27/2013    Procedure: REPEAT CESAREAN SECTION;  Surgeon: Claiborne Billings A. Pamala Hurry, MD;  Location: [facility];  Service: Obstetrics;  Laterality: N/A;  Repeat C/S  EDD: 04/23/13  . Knee arthroscopy w/ partial medial meniscectomy Right 2000    Social History   Social History  . Marital Status: Married    Spouse Name: N/A  . Number of Children: N/A  . Years of Education: N/A   Occupational History  . Not on file.   Social History Main Topics  . Smoking status: Never Smoker   . Smokeless tobacco: Never Used  . Alcohol Use: No  . Drug Use: No  . Sexual Activity: Yes   Other Topics Concern  . Not on file   Social History Narrative   Married.   One son, [name] (age 8).   [redacted] weeks pregnant- 11/2011.  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    Family History  Problem Relation Age of Onset  . Hypertension Mother   . Diabetes Mother   . Arthritis Mother   . Arthritis Father     Allergies  Allergen Reactions  . Codeine Nausea And Vomiting    Medication list reviewed and updated in full in [facility].  GEN: No fevers, chills. Nontoxic. Primarily MSK c/o today. MSK: Detailed in the HPI GI: tolerating PO intake without difficulty Neuro: No numbness, parasthesias, or tingling associated. Otherwise the pertinent positives of the ROS are noted above.   Objective:   Blood pressure 118/90, pulse 80, temperature 98.5 F (36.9 C), temperature source Oral, height 5\' 7"   (1.702 m), weight 247 lb 12 oz (112.379 kg).  GEN: Well-developed,well-nourished,in no acute distress; alert,appropriate and cooperative throughout examination HEENT: Normocephalic and atraumatic without obvious abnormalities. Ears, externally no deformities PULM: Breathing comfortably in no respiratory distress EXT: No clubbing, cyanosis, or edema PSYCH: Normally interactive. Cooperative during the interview. Pleasant. Friendly and conversant. Not anxious or depressed appearing. Normal, full affect.  Shoulder: L Inspection: No muscle wasting or winging Ecchymosis/edema: neg  Mild ttp at trap on the L AC joint, scapula, clavicle: NT Cervical spine: NT, full ROM Spurling's: neg Abduction: full, 5/5 Flexion: full, 5/5 IR, full, lift-off: 5/5 ER at neutral: full, 5/5 AC crossover and compression: neg Neer: neg Hawkins: neg Drop Test: neg Empty Can: neg Supraspinatus insertion: NT Bicipital groove: NT Speed's: neg Yergason's: neg Sulcus sign: neg Scapular dyskinesis: none C5-T1 intact Sensation intact Grip 5/5   L elbow Ecchymosis or edema: neg ROM: full flexion, extension, pronation, supination Shoulder ROM: Full Flexion: 5/5 Extension: 5/5, PAINFUL Supination: 5/5, PAINFUL Pronation: 5/5 Wrist ext: 5/5 Wrist flexion: 5/5 No gross bony abnormality Varus and Valgus stress: stable ECRB tenderness: YES, TTP Medial epicondyle: NT Lateral epicondyle, resisted wrist extension from wrist full pronation and flexion: PAINFUL grip: 5/5  sensation intact Tinel's, Elbow: negative  Subjective:   Lateral epicondylitis, left  >25 minutes spent in face to face time with patient, >50% spent in counselling or coordination of care  Elbow anatomy was reviewed, and tendinopathy was explained.  Pt. given a formal rehab program from [facility] on elbow rehabiliation.  Series of concentric and eccentric exercises should be done starting with no weight, work up to 1 lb, hammer,  etc.  Use counterforce strap if working or using hands.  Formal PT would be beneficial - if failure to improve. Emphasized stretching an cross-friction massage Emphasized proper palms up lifting biomechanics to unload ECRB  Follow-up: if not improved in 6 weeks  Signed,  Danaisha Celli T. Naturi Alarid, MD   Patient's Medications  New Prescriptions   No medications on file  Previous Medications   [facility] B-12 1000 [id number] LIQD       FOLIC ACID (FOLVITE) 1 MG TABLET    TAKE 4 TABLETS BY MOUTH EVERY DAY   LISINOPRIL (PRINIVIL,ZESTRIL) 10 MG TABLET    Take 1 tablet (10 mg total) by mouth daily.   VITAMIN D, ERGOCALCIFEROL, (DRISDOL) 50000 UNITS CAPS CAPSULE    Take 1 capsule (50,000 Units total) by mouth every 7 (seven) days.  Modified Medications   No medications on file  Discontinued Medications   No medications on file

## 2016-02-03 ENCOUNTER — Ambulatory Visit: Payer: 59 | Admitting: Family Medicine

## 2016-02-06 ENCOUNTER — Other Ambulatory Visit: Payer: Self-pay | Admitting: Family Medicine

## 2016-02-07 ENCOUNTER — Ambulatory Visit (INDEPENDENT_AMBULATORY_CARE_PROVIDER_SITE_OTHER): Payer: 59 | Admitting: Family Medicine

## 2016-02-07 ENCOUNTER — Telehealth: Payer: Self-pay | Admitting: Family Medicine

## 2016-02-07 ENCOUNTER — Encounter: Payer: Self-pay | Admitting: Family Medicine

## 2016-02-07 VITALS — BP 124/92 | HR 86 | Temp 98.1°F | Wt 249.2 lb

## 2016-02-07 DIAGNOSIS — E559 Vitamin D deficiency, unspecified: Secondary | ICD-10-CM

## 2016-02-07 DIAGNOSIS — F411 Generalized anxiety disorder: Secondary | ICD-10-CM

## 2016-02-07 DIAGNOSIS — R5383 Other fatigue: Secondary | ICD-10-CM | POA: Diagnosis not present

## 2016-02-07 DIAGNOSIS — F4322 Adjustment disorder with anxiety: Secondary | ICD-10-CM | POA: Insufficient documentation

## 2016-02-07 LAB — VITAMIN D 25 HYDROXY (VIT D DEFICIENCY, FRACTURES): VITD: 20.97 ng/mL — ABNORMAL LOW (ref 30.00–100.00)

## 2016-02-07 MED ORDER — SERTRALINE HCL 25 MG PO TABS: 25.0000 mg | ORAL_TABLET | Freq: Every day | ORAL | Status: DC

## 2016-02-07 NOTE — Patient Instructions (Signed)
Great to see you. We are starting zoloft 25 mg daily.  Please update me in a few weeks.  Someone will you about the referral to see you a therapist.

## 2016-02-07 NOTE — Progress Notes (Signed)
Subjective:   Patient ID: Jill Cruz, female    DOB: 04/06/1966, 50 y.o.   MRN: [id number]  Jill Cruz is a pleasant 50 y.o. year old female who presents to clinic today with Fatigue  on 02/07/2016  HPI:  Fatigue- she does have a h/o Vit D deficiency.  Saw her in February for this complaint.  Note reviewed from 11/30/15.  Vit D was 10.84 that day, given course of high dose Vit D for 6 weeks and advised to continue taking 1600 IU daily after high dose completed.   At that [place], we also check B12, CBC and TSH which were all normal.  Symptoms still the same.  Over past 3 months, increased symptoms of "panic," tearfulness and insomnia. More stressors at home. Denies SI and HI.  Melatonin does help her to fall asleep but cannot seem to stay asleep.  Wt Readings from Last 3 Encounters:  02/07/16 249 lb 4 oz (113.059 kg)  12/16/15 247 lb 12 oz (112.379 kg)  11/30/15 251 lb 4 oz (113.966 kg)     Current Outpatient Prescriptions on File Prior to Visit  Medication Sig Dispense Refill  . [facility] B-12 1000 [id number] LIQD   0  . folic acid (FOLVITE) 1 MG tablet TAKE 4 TABLETS BY MOUTH EVERY DAY 120 tablet 7  . lisinopril (PRINIVIL,ZESTRIL) 10 MG tablet Take 1 tablet (10 mg total) by mouth daily. 90 tablet 3  . Vitamin D, Ergocalciferol, (DRISDOL) 50000 units CAPS capsule Take 1 capsule (50,000 Units total) by mouth every 7 (seven) days. 6 capsule 0   No current facility-administered medications on file prior to visit.    Allergies  Allergen Reactions  . Codeine Nausea And Vomiting    Past Medical History  Diagnosis Date  . History of pre-eclampsia in prior pregnancy, currently pregnant   . PONV (postoperative nausea and vomiting)   . Hypertension     On Labetalol  . Anxiety 2011    Post Partum Anxiety/Depression treated w/ medication. Now resolved.  Marland Kitchen GERD (gastroesophageal reflux disease)     Mild baseline- worse w/pregnancy. Takes TUMS prn.  . Postpartum care  following cesarean delivery (03/27/13) 03/27/2013  . MTHFR mutation ([place]) 03/27/2013  . S/P repeat low transverse C-section 03/30/2013    Past Surgical History  Procedure Laterality Date  . Cesarean section  2011    at [facility]  . Cholecystectomy  ~2003    [facility] in [place]  . Knee arthroscopy w/ acl reconstruction and patella graft Right 2000    [facility]. in [place]  . Diagnostic laparoscopy Left ~2008    Ovarian Cyst removed- [facility]. in [place]  . Cesarean section N/A 03/27/2013    Procedure: REPEAT CESAREAN SECTION;  Surgeon: Claiborne Billings A. Pamala Hurry, MD;  Location: [facility];  Service: Obstetrics;  Laterality: N/A;  Repeat C/S  EDD: 04/23/13  . Knee arthroscopy w/ partial medial meniscectomy Right 2000    Family History  Problem Relation Age of Onset  . Hypertension Mother   . Diabetes Mother   . Arthritis Mother   . Arthritis Father     Social History   Social History  . Marital Status: Married    Spouse Name: N/A  . Number of Children: N/A  . Years of Education: N/A   Occupational History  . Not on file.   Social History Main Topics  . Smoking status: Never Smoker   . Smokeless tobacco: Never Used  . Alcohol Use: No  .  Drug Use: No  . Sexual Activity: Yes   Other Topics Concern  . Not on file   Social History Narrative   Married.   One son, [name] (age 28).   [redacted] weeks pregnant- 11/2011.   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   The PMH, PSH, Social History, Family History, Medications, and allergies have been reviewed in [facility], and have been updated if relevant.  Review of Systems  Constitutional: Positive for fatigue.  HENT: Negative.   Eyes: Negative.   Respiratory: Negative.   Cardiovascular: Negative.   Gastrointestinal: Negative.   Endocrine: Negative.   Musculoskeletal: Negative for myalgias.  Allergic/Immunologic: Negative.   Neurological: Negative.   Psychiatric/Behavioral: Positive for sleep disturbance and dysphoric mood. Negative for suicidal ideas,  hallucinations, behavioral problems, self-injury and agitation. The patient is nervous/anxious. The patient is not hyperactive.   All other systems reviewed and are negative.      Objective:    BP 124/92 mmHg  Pulse 86  Temp(Src) 98.1 F (36.7 C) (Oral)  Wt 249 lb 4 oz (113.059 kg)  SpO2 97%  LMP 08/02/2014   Physical Exam  Constitutional: She is oriented to person, place, and time. No distress.  obese  HENT:  Head: Normocephalic.  Eyes: Conjunctivae are normal.  Cardiovascular: Normal rate.   Pulmonary/Chest: Effort normal.  Musculoskeletal: Normal range of motion.  Neurological: She is alert and oriented to person, place, and time. No cranial nerve deficit.  Skin: Skin is warm and dry.  Psychiatric: She has a normal mood and affect. Her behavior is normal. Judgment and thought content normal.          Assessment & Plan:   Generalized anxiety disorder - Plan: Ambulatory referral to Psychology  Vitamin D deficiency - Plan: Vitamin D, 25-hydroxy No Follow-up on file.

## 2016-02-07 NOTE — Progress Notes (Signed)
Pre visit review using our clinic review tool, if applicable. No additional management support is needed unless otherwise documented below in the visit note. 

## 2016-02-07 NOTE — Assessment & Plan Note (Signed)
Deteriorated. >25 minutes spent in face to face time with patient, >50% spent in counselling or coordination of care discussing anxiety, fatigue and insomnia. Start zoloft 35 mg daily, referral placed to psychology for psychotherapy. Recheck Vit D today. The patient indicates understanding of these issues and agrees with the plan. Orders Placed This Encounter  Procedures  . Vitamin D, 25-hydroxy  . Ambulatory referral to Psychology

## 2016-02-07 NOTE — Telephone Encounter (Signed)
Is pt needing to continue? pls advise

## 2016-02-07 NOTE — Telephone Encounter (Signed)
Pt placed on [facility] WQ to be scheduled.

## 2016-02-10 ENCOUNTER — Telehealth: Payer: Self-pay | Admitting: Family Medicine

## 2016-02-10 NOTE — Telephone Encounter (Signed)
Patient returned Waynetta's call. °

## 2016-02-11 MED ORDER — VITAMIN D (ERGOCALCIFEROL) 1.25 MG (50000 UNIT) PO CAPS: 50000.0000 [IU] | ORAL_CAPSULE | ORAL | Status: DC

## 2016-02-11 NOTE — Addendum Note (Signed)
Addended by: Modena Nunnery on: 02/11/2016 08:52 AM   Modules accepted: Orders

## 2016-02-11 NOTE — Telephone Encounter (Signed)
See results note. 

## 2016-03-06 ENCOUNTER — Other Ambulatory Visit: Payer: 59

## 2016-03-06 ENCOUNTER — Other Ambulatory Visit: Payer: Self-pay | Admitting: Family Medicine

## 2016-03-23 ENCOUNTER — Ambulatory Visit (INDEPENDENT_AMBULATORY_CARE_PROVIDER_SITE_OTHER): Payer: 59 | Admitting: Psychology

## 2016-03-23 DIAGNOSIS — F4322 Adjustment disorder with anxiety: Secondary | ICD-10-CM | POA: Diagnosis not present

## 2016-04-10 ENCOUNTER — Ambulatory Visit: Payer: 59 | Admitting: Psychology

## 2016-04-14 ENCOUNTER — Other Ambulatory Visit: Payer: Self-pay | Admitting: *Deleted

## 2016-04-14 MED ORDER — SERTRALINE HCL 25 MG PO TABS: 25.0000 mg | ORAL_TABLET | Freq: Every day | ORAL | Status: DC

## 2016-09-04 ENCOUNTER — Other Ambulatory Visit: Payer: Self-pay | Admitting: Family Medicine

## 2016-09-05 NOTE — Telephone Encounter (Signed)
Last f/u 12/2014 

## 2016-09-05 NOTE — Telephone Encounter (Signed)
eRx sent.  Needs follow up for further refills.

## 2016-09-17 ENCOUNTER — Other Ambulatory Visit: Payer: Self-pay | Admitting: Family Medicine

## 2016-11-06 ENCOUNTER — Ambulatory Visit (INDEPENDENT_AMBULATORY_CARE_PROVIDER_SITE_OTHER): Payer: 59 | Admitting: Family Medicine

## 2016-11-06 ENCOUNTER — Encounter: Payer: Self-pay | Admitting: Family Medicine

## 2016-11-06 VITALS — BP 128/62 | HR 55 | Temp 97.9°F | Ht 67.0 in | Wt 255.8 lb

## 2016-11-06 DIAGNOSIS — R197 Diarrhea, unspecified: Secondary | ICD-10-CM

## 2016-11-06 DIAGNOSIS — I10 Essential (primary) hypertension: Secondary | ICD-10-CM

## 2016-11-06 DIAGNOSIS — Z1211 Encounter for screening for malignant neoplasm of colon: Secondary | ICD-10-CM

## 2016-11-06 DIAGNOSIS — E559 Vitamin D deficiency, unspecified: Secondary | ICD-10-CM

## 2016-11-06 DIAGNOSIS — F411 Generalized anxiety disorder: Secondary | ICD-10-CM | POA: Diagnosis not present

## 2016-11-06 DIAGNOSIS — Z01419 Encounter for gynecological examination (general) (routine) without abnormal findings: Secondary | ICD-10-CM | POA: Diagnosis not present

## 2016-11-06 LAB — CBC WITH DIFFERENTIAL/PLATELET
BASOS PCT: 0.5 % (ref 0.0–3.0)
Basophils Absolute: 0 10*3/uL (ref 0.0–0.1)
EOS ABS: 0.2 10*3/uL (ref 0.0–0.7)
Eosinophils Relative: 2.2 % (ref 0.0–5.0)
HCT: 42 % (ref 36.0–46.0)
Hemoglobin: 14.2 g/dL (ref 12.0–15.0)
Lymphocytes Relative: 28.6 % (ref 12.0–46.0)
Lymphs Abs: 2 10*3/uL (ref 0.7–4.0)
MCHC: 33.8 g/dL (ref 30.0–36.0)
MCV: 86.8 fl (ref 78.0–100.0)
MONO ABS: 0.5 10*3/uL (ref 0.1–1.0)
Monocytes Relative: 7.3 % (ref 3.0–12.0)
NEUTROS ABS: 4.4 10*3/uL (ref 1.4–7.7)
Neutrophils Relative %: 61.4 % (ref 43.0–77.0)
PLATELETS: 290 10*3/uL (ref 150.0–400.0)
RBC: 4.84 Mil/uL (ref 3.87–5.11)
RDW: 13.3 % (ref 11.5–15.5)
WBC: 7.1 10*3/uL (ref 4.0–10.5)

## 2016-11-06 LAB — VITAMIN D 25 HYDROXY (VIT D DEFICIENCY, FRACTURES): VITD: 26.91 ng/mL — AB (ref 30.00–100.00)

## 2016-11-06 LAB — COMPREHENSIVE METABOLIC PANEL
ALT: 13 U/L (ref 0–35)
AST: 14 U/L (ref 0–37)
Albumin: 4.4 g/dL (ref 3.5–5.2)
Alkaline Phosphatase: 61 U/L (ref 39–117)
BUN: 12 mg/dL (ref 6–23)
CALCIUM: 9.8 mg/dL (ref 8.4–10.5)
CHLORIDE: 106 meq/L (ref 96–112)
CO2: 28 meq/L (ref 19–32)
CREATININE: 0.71 mg/dL (ref 0.40–1.20)
GFR: 92.54 mL/min (ref >60.00)
Glucose, Bld: 90 mg/dL (ref 70–99)
Potassium: 4.5 mEq/L (ref 3.5–5.1)
SODIUM: 139 meq/L (ref 135–145)
Total Bilirubin: 0.4 mg/dL (ref 0.2–1.2)
Total Protein: 7.5 g/dL (ref 6.0–8.3)

## 2016-11-06 LAB — VITAMIN B12: Vitamin B-12: 742 pg/mL (ref 211–911)

## 2016-11-06 LAB — LIPID PANEL
Cholesterol: 140 mg/dL (ref 0–200)
HDL: 49.1 mg/dL (ref >39.00)
LDL CALC: 70 mg/dL (ref 0–99)
NonHDL: 90.4
TRIGLYCERIDES: 101 mg/dL (ref 0.0–149.0)
Total CHOL/HDL Ratio: 3
VLDL: 20.2 mg/dL (ref 0.0–40.0)

## 2016-11-06 LAB — TSH: TSH: 0.69 u[IU]/mL (ref 0.35–4.50)

## 2016-11-06 LAB — H. PYLORI ANTIBODY, IGG: H Pylori IgG: NEGATIVE

## 2016-11-06 NOTE — Progress Notes (Signed)
Pre visit review using our clinic review tool, if applicable. No additional management support is needed unless otherwise documented below in the visit note. 

## 2016-11-06 NOTE — Assessment & Plan Note (Signed)
Symptoms controlled with low dose Zoloft. No changes made today.

## 2016-11-06 NOTE — Assessment & Plan Note (Signed)
Unclear etiology.  Advised keeping a symptoms journal, add daily probioitic. Refer to GI for screening colonoscopy and to discuss these symptoms. Will also check H pylori. The patient indicates understanding of these issues and agrees with the plan.'

## 2016-11-06 NOTE — Patient Instructions (Addendum)
Great to see you.  We will call you with your lab results.  Let's try a daily probiotic again (like Align).  Keep a symptoms.  We will call your GI appointment.

## 2016-11-06 NOTE — Assessment & Plan Note (Signed)
Well controlled on current rx. No changes made. 

## 2016-11-06 NOTE — Assessment & Plan Note (Signed)
Reviewed preventive care protocols, scheduled due services, and updated immunizations Discussed nutrition, exercise, diet, and healthy lifestyle.  Orders Placed This Encounter  Procedures  . CBC with Differential/Platelet  . Comprehensive metabolic panel  . Lipid panel  . TSH  . VITAMIN D 25 Hydroxy (Vit-D Deficiency, Fractures)  . Vitamin B12  . H. pylori antibody, IgG  . Ambulatory referral to Gastroenterology

## 2016-11-06 NOTE — Progress Notes (Signed)
Subjective:   Patient ID: Jill Cruz, female    DOB: 20-Oct-1965, 51 y.o.   MRN: [id number]  Jill Cruz is a pleasant 51 y.o. year old female who presents to clinic today with Annual Exam and Diarrhea  on 11/06/2016  HPI:  Has GYN- Dr. Erroll Luna. Per pt Pap smear, pelvic US and mammogram done at her office a few months ago.  All benign.  Diarrhea- past 6 months, daily urgency to have BM, especially after breakfast.  Feels it is happening with more frequency.  No blood in her stool.  No nausea or vomiting.  No mucous.  Never had a colonoscopy.  No FH of colon CA.  Feels zoloft is working well at current dose.  Current Outpatient Prescriptions on File Prior to Visit  Medication Sig Dispense Refill  . [facility] B-12 1000 [id number] LIQD   0  . folic acid (FOLVITE) 1 MG tablet TAKE 4 TABLETS BY MOUTH EVERY DAY 120 tablet 4  . lisinopril (PRINIVIL,ZESTRIL) 10 MG tablet TAKE 1 TABLET (10 MG TOTAL) BY MOUTH DAILY. 90 tablet 1  . sertraline (ZOLOFT) 25 MG tablet Take 1 tablet (25 mg total) by mouth daily. 90 tablet 2   No current facility-administered medications on file prior to visit.     Allergies  Allergen Reactions  . Codeine Nausea And Vomiting    Past Medical History:  Diagnosis Date  . Anxiety 2011   Post Partum Anxiety/Depression treated w/ medication. Now resolved.  Marland Kitchen GERD (gastroesophageal reflux disease)    Mild baseline- worse w/pregnancy. Takes TUMS prn.  . History of pre-eclampsia in prior pregnancy, currently pregnant   . Hypertension    On Labetalol  . MTHFR mutation ([place]) 03/27/2013  . PONV (postoperative nausea and vomiting)   . Postpartum care following cesarean delivery (03/27/13) 03/27/2013  . S/P repeat low transverse C-section 03/30/2013    Past Surgical History:  Procedure Laterality Date  . CESAREAN SECTION  2011   at [facility]  . CESAREAN SECTION N/A 03/27/2013   Procedure: REPEAT CESAREAN SECTION;  Surgeon: Claiborne Billings A. Pamala Hurry, MD;  Location: [facility];  Service: Obstetrics;  Laterality: N/A;  Repeat C/S  EDD: 04/23/13  . CHOLECYSTECTOMY  ~2003   [facility] in [place]  . DIAGNOSTIC LAPAROSCOPY Left ~2008   Ovarian Cyst removed- [facility]. in [place]  . KNEE ARTHROSCOPY W/ ACL RECONSTRUCTION AND PATELLA GRAFT Right 2000   [facility]. in [place]  . KNEE ARTHROSCOPY W/ PARTIAL MEDIAL MENISCECTOMY Right 2000    Family History  Problem Relation Age of Onset  . Hypertension Mother   . Diabetes Mother   . Arthritis Mother   . Arthritis Father     Social History   Social History  . Marital status: Married    Spouse name: N/A  . Number of children: N/A  . Years of education: N/A   Occupational History  . Not on file.   Social History Main Topics  . Smoking status: Never Smoker  . Smokeless tobacco: Never Used  . Alcohol use No  . Drug use: No  . Sexual activity: Yes   Other Topics Concern  . Not on file   Social History Narrative   Married.   One son, [name] (age 33).   [redacted] weeks pregnant- 11/2011.   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   The PMH, PSH, Social History, Family History, Medications, and allergies have been reviewed in [facility], and have been updated if relevant.   Review  of Systems  Constitutional: Positive for fatigue.  HENT: Negative.   Respiratory: Negative.   Gastrointestinal: Positive for diarrhea. Negative for anal bleeding, blood in stool, constipation, nausea, rectal pain and vomiting.  Endocrine: Negative.   Genitourinary: Negative.   Musculoskeletal: Negative.   Neurological: Negative.   Hematological: Negative.   Psychiatric/Behavioral: Negative.   All other systems reviewed and are negative.      Objective:    BP 128/62   Pulse (!) 55   Temp 97.9 F (36.6 C) (Oral)   Ht 5\' 7"  (1.702 m)   Wt 255 lb 12 oz (116 kg)   LMP 08/04/2014   SpO2 98%   BMI 40.06 kg/m    Physical Exam  Constitutional: She is oriented to person, place, and time. She appears well-developed and well-nourished. No  distress.  HENT:  Head: Normocephalic and atraumatic.  Eyes: Conjunctivae are normal.  Neck: Neck supple.  Cardiovascular: Normal rate and regular rhythm.   Pulmonary/Chest: Effort normal and breath sounds normal.  Abdominal: Soft. Bowel sounds are normal. She exhibits no distension and no mass. There is no tenderness. There is no rebound and no guarding.  Musculoskeletal: Normal range of motion. She exhibits no tenderness.  Neurological: She is alert and oriented to person, place, and time. No cranial nerve deficit.  Skin: Skin is warm and dry. She is not diaphoretic.  Psychiatric: She has a normal mood and affect. Her behavior is normal. Judgment and thought content normal.  Nursing note and vitals reviewed.        Assessment & Plan:   Essential hypertension  Well woman exam  Generalized anxiety disorder  Vitamin D deficiency No Follow-up on file.

## 2016-11-10 ENCOUNTER — Encounter: Payer: Self-pay | Admitting: Family Medicine

## 2016-11-27 DIAGNOSIS — M25561 Pain in right knee: Secondary | ICD-10-CM | POA: Diagnosis not present

## 2016-11-27 DIAGNOSIS — M25562 Pain in left knee: Secondary | ICD-10-CM | POA: Diagnosis not present

## 2016-11-27 DIAGNOSIS — M170 Bilateral primary osteoarthritis of knee: Secondary | ICD-10-CM | POA: Diagnosis not present

## 2016-12-04 ENCOUNTER — Ambulatory Visit (INDEPENDENT_AMBULATORY_CARE_PROVIDER_SITE_OTHER): Payer: 59 | Admitting: Family Medicine

## 2016-12-04 ENCOUNTER — Encounter: Payer: Self-pay | Admitting: Family Medicine

## 2016-12-04 ENCOUNTER — Ambulatory Visit (INDEPENDENT_AMBULATORY_CARE_PROVIDER_SITE_OTHER)
Admission: RE | Admit: 2016-12-04 | Discharge: 2016-12-04 | Disposition: A | Discharge status: Home / Self Care | Payer: 59 | Source: Ambulatory Visit | Attending: Family Medicine | Admitting: Family Medicine

## 2016-12-04 VITALS — BP 110/80 | HR 68 | Temp 98.3°F | Ht 67.0 in | Wt 256.8 lb

## 2016-12-04 DIAGNOSIS — M25561 Pain in right knee: Secondary | ICD-10-CM | POA: Diagnosis not present

## 2016-12-04 DIAGNOSIS — M1711 Unilateral primary osteoarthritis, right knee: Secondary | ICD-10-CM | POA: Diagnosis not present

## 2016-12-04 DIAGNOSIS — M25461 Effusion, right knee: Secondary | ICD-10-CM | POA: Diagnosis not present

## 2016-12-04 NOTE — Progress Notes (Signed)
Pre visit review using our clinic review tool, if applicable. No additional management support is needed unless otherwise documented below in the visit note. 

## 2016-12-04 NOTE — Progress Notes (Signed)
Dr. Frederico Hamman T. Jahzier Villalon, MD, [facility] [place], [address] Phone: [phone] Fax: [phone]  12/04/2016  Patient: Jill Cruz, MRN: [id number], DOB: 07/06/1966, 51 y.o.  Primary Physician:  Arnette Norris, MD   Chief Complaint  Patient presents with  . Knee Pain    Right   Subjective:   [name] is a 51 y.o. very pleasant female patient who presents with the following:  Went to flexogenics. R knee hurts some. On and off.   ? Old ACL - 15 years ago on the R.  The practitioners at the above clinic were concerned about her prior anterior cruciate ligament surgery. She had a round of what sounds to be hyaluronic acid would good success of 6 months relief of symptoms. She is here today in follow-up regarding her knee.  Past Medical History, Surgical History, Social History, Family History, Problem List, Medications, and Allergies have been reviewed and updated if relevant.  Patient Active Problem List   Diagnosis Date Noted  . Well woman exam 11/06/2016  . Diarrhea 11/06/2016  . Generalized anxiety disorder 02/07/2016  . Vitamin D deficiency 11/30/2015  . Arthralgia 06/09/2015  . Fatigue 06/09/2015  . HTN (hypertension) 05/16/2013  . S/P repeat low transverse C-section 03/30/2013    Past Medical History:  Diagnosis Date  . Anxiety 2011   Post Partum Anxiety/Depression treated w/ medication. Now resolved.  Marland Kitchen GERD (gastroesophageal reflux disease)    Mild baseline- worse w/pregnancy. Takes TUMS prn.  . History of pre-eclampsia in prior pregnancy, currently pregnant   . Hypertension    On Labetalol  . MTHFR mutation ([place]) 03/27/2013  . PONV (postoperative nausea and vomiting)   . Postpartum care following cesarean delivery (03/27/13) 03/27/2013  . S/P repeat low transverse C-section 03/30/2013    Past Surgical History:  Procedure Laterality Date  . CESAREAN SECTION  2011   at [facility]  .  CESAREAN SECTION Jill Cruz 03/27/2013   Procedure: REPEAT CESAREAN SECTION;  Surgeon: Claiborne Billings A. Pamala Hurry, MD;  Location: [facility];  Service: Obstetrics;  Laterality: Jill Cruz;  Repeat C/S  EDD: 04/23/13  . CHOLECYSTECTOMY  ~2003   [facility] in [place]  . DIAGNOSTIC LAPAROSCOPY Left ~2008   Ovarian Cyst removed- [facility]. in [place]  . KNEE ARTHROSCOPY W/ ACL RECONSTRUCTION AND PATELLA GRAFT Right 2000   [facility]. in [place]  . KNEE ARTHROSCOPY W/ PARTIAL MEDIAL MENISCECTOMY Right 2000    Social History   Social History  . Marital status: Married    Spouse name: Jill Cruz  . Number of children: Jill Cruz  . Years of education: Jill Cruz   Occupational History  . Not on file.   Social History Main Topics  . Smoking status: Never Smoker  . Smokeless tobacco: Never Used  . Alcohol use No  . Drug use: No  . Sexual activity: Yes   Other Topics Concern  . Not on file   Social History Narrative   Married.   One son, Jill Cruz (age 72).   [redacted] weeks pregnant- 11/2011.   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    Family History  Problem Relation Age of Onset  . Hypertension Mother   . Diabetes Mother   . Arthritis Mother   . Arthritis Father     Allergies  Allergen Reactions  . Codeine Nausea And Vomiting    Medication list reviewed and updated in full in [facility].  GEN: No fevers, chills.  Nontoxic. Primarily MSK c/o today. MSK: Detailed in the HPI GI: tolerating PO intake without difficulty Neuro: No numbness, parasthesias, or tingling associated. Otherwise the pertinent positives of the ROS are noted above.   Objective:   BP 110/80   Pulse 68   Temp 98.3 F (36.8 C) (Oral)   Ht 5\' 7"  (1.702 m)   Wt 256 lb 12 oz (116.5 kg)   LMP 08/04/2014   BMI 40.21 kg/m    GEN: WDWN, NAD, Non-toxic, Alert & Oriented x 3 HEENT: Atraumatic, Normocephalic.  Ears and Nose: No external deformity. EXTR: No clubbing/cyanosis/edema NEURO: Normal gait.  PSYCH: Normally interactive. Conversant. Not depressed or  anxious appearing.  Calm demeanor.   Knee:  R Gait: Normal heel toe pattern ROM: 0-125 Effusion: neg Echymosis or edema: none Patellar tendon NT Painful PLICA: neg Patellar grind: negative Medial and lateral patellar facet loading: negative medial and lateral joint lines: medial pain Mcmurray's neg Flexion-pinch neg Varus and valgus stress: stable Lachman: neg Ant and Post drawer: neg Hip abduction, IR, ER: WNL Hip flexion str: 5/5 Hip abd: 5/5 Quad: 5/5 VMO atrophy:No Hamstring concentric and eccentric: 5/5   Radiology: Dg Knee Ap/lat W/sunrise Right  Result Date: 12/04/2016 CLINICAL DATA:  Right knee pain EXAM: RIGHT KNEE 3 VIEWS COMPARISON:  06/17/2015 FINDINGS: Four views of the right knee submitted. Again noted postsurgical changes post ACL repair. Again noted degenerative changes with joint space narrowing. There is spurring of right lateral femoral condyle and medial tibial plateau. Narrowing of patellofemoral joint space. Small joint effusion. Spurring of patella. No acute fracture or subluxation. IMPRESSION: No acute fracture or subluxation. Stable postsurgical changes post ACL repair. Again noted osteoarthritic changes as described above with slight progression from prior exam. Small joint effusion. Electronically Signed   By: Lahoma Crocker M.D.   On: 12/04/2016 10:22     Assessment and Plan:   Right knee pain, unspecified chronicity - Plan: DG Knee AP/LAT W/Sunrise Right  Primary localized osteoarthritis of right knee - Plan: DG Knee AP/LAT W/Sunrise Right  Her knee is stable. Her anterior cruciate ligament BTB graft is stable. No concerns regarding her x-rays, which appear grossly the same compared to priors with the exception of arthritis being slightly worse.  Reassured the patient. She certainly should be fine to have further conservative measures for her knee. That would be true whether she have them here or in a different clinic of her choosing.  Follow-up: No  Follow-up on file.  Meds ordered this encounter  Medications  . nystatin-triamcinolone (MYCOLOG II) cream   There are no discontinued medications. Orders Placed This Encounter  Procedures  . DG Knee AP/LAT W/Sunrise Right    Signed,  Clementina Mareno T. Zoei Amison, MD   Allergies as of 12/04/2016      Reactions   Codeine Nausea And Vomiting      Medication List       Accurate as of 12/04/16  1:56 PM. Always use your most recent med list.          [employer] B-12 1000 MCG/15ML Liqd Generic drug:  Cyanocobalamin   folic acid 1 MG tablet Commonly known as:  FOLVITE TAKE 4 TABLETS BY MOUTH EVERY DAY   lisinopril 10 MG tablet Commonly known as:  PRINIVIL,ZESTRIL TAKE 1 TABLET (10 MG TOTAL) BY MOUTH DAILY.   nystatin-triamcinolone cream Commonly known as:  MYCOLOG II   sertraline 25 MG tablet Commonly known as:  ZOLOFT Take 1 tablet (25 mg total) by mouth daily.

## 2016-12-19 ENCOUNTER — Encounter: Payer: Self-pay | Admitting: Gastroenterology

## 2016-12-19 ENCOUNTER — Ambulatory Visit (INDEPENDENT_AMBULATORY_CARE_PROVIDER_SITE_OTHER): Payer: 59 | Admitting: Gastroenterology

## 2016-12-19 ENCOUNTER — Ambulatory Visit: Payer: Self-pay | Admitting: Gastroenterology

## 2016-12-19 ENCOUNTER — Other Ambulatory Visit: Payer: Self-pay

## 2016-12-19 VITALS — BP 142/82 | HR 58 | Temp 98.4°F | Ht 67.0 in | Wt 260.0 lb

## 2016-12-19 DIAGNOSIS — R194 Change in bowel habit: Secondary | ICD-10-CM

## 2016-12-19 DIAGNOSIS — R197 Diarrhea, unspecified: Secondary | ICD-10-CM | POA: Diagnosis not present

## 2016-12-19 NOTE — Progress Notes (Signed)
Gastroenterology Consultation  Referring Provider:     Lucille Passy, MD Primary Care Physician:  Arnette Norris, MD Primary Gastroenterologist:  Dr. Allen Norris     Reason for Consultation:     Diarrhea        HPI:   Jill Cruz is a 51 y.o. y/o female referred for consultation & management of Diarrhea by Dr. Arnette Norris, MD.  This patient comes today with a report of diarrhea for the last 9-10 months. The patient reports that during this time she has not had any rectal bleeding or unexplained weight loss. The patient actually states she has gained weight. The patient does not have the diarrhea waking her up from a sleep. The patient denies any family history of colon cancer colon polyps. The patient also denies any family history of Crohn's disease or ulcerative colitis. The patient states that the diarrhea consists of soft stools that are probably 4 times a day. The patient does not have any watery diarrhea but states that when she has the loose stools she will have explosive expulsion of her stools. The patient does use dairy products. She does report that she thinks foods can influence her bowel habits. Past Medical History:  Diagnosis Date  . Anxiety 2011   Post Partum Anxiety/Depression treated w/ medication. Now resolved.  Marland Kitchen GERD (gastroesophageal reflux disease)    Mild baseline- worse w/pregnancy. Takes TUMS prn.  . History of pre-eclampsia in prior pregnancy, currently pregnant   . Hypertension    On Labetalol  . MTHFR mutation ([employer]) 03/27/2013  . PONV (postoperative nausea and vomiting)   . Postpartum care following cesarean delivery (03/27/13) 03/27/2013  . S/P repeat low transverse C-section 03/30/2013    Past Surgical History:  Procedure Laterality Date  . CESAREAN SECTION  2011   at [facility]  . CESAREAN SECTION N/A 03/27/2013   Procedure: REPEAT CESAREAN SECTION;  Surgeon: Claiborne Billings A. Pamala Hurry, MD;  Location: [facility];  Service: Obstetrics;  Laterality: N/A;  Repeat C/S  EDD: 04/23/13    . CHOLECYSTECTOMY  ~2003   [facility] in [place]  . DIAGNOSTIC LAPAROSCOPY Left ~2008   Ovarian Cyst removed- [facility]. in [place]  . KNEE ARTHROSCOPY W/ ACL RECONSTRUCTION AND PATELLA GRAFT Right 2000   [facility] in [place]  . KNEE ARTHROSCOPY W/ PARTIAL MEDIAL MENISCECTOMY Right 2000    Prior to Admission medications   Medication Sig Start Date End Date Taking? Authorizing Provider  CVS B-12 1000 MCG/15ML LIQD  08/14/14  Yes Historical Provider, MD  folic acid (FOLVITE) 1 MG tablet TAKE 4 TABLETS BY MOUTH EVERY DAY 09/18/16  Yes Lucille Passy, MD  lisinopril (PRINIVIL,ZESTRIL) 10 MG tablet TAKE 1 TABLET (10 MG TOTAL) BY MOUTH DAILY. 09/05/16  Yes Lucille Passy, MD  nystatin-triamcinolone (MYCOLOG II) cream  11/07/16  Yes Historical Provider, MD  sertraline (ZOLOFT) 25 MG tablet Take 1 tablet (25 mg total) by mouth daily. 04/14/16  Yes Lucille Passy, MD    Family History  Problem Relation Age of Onset  . Hypertension Mother   . Diabetes Mother   . Arthritis Mother   . Arthritis Father      Social History  Substance Use Topics  . Smoking status: Never Smoker  . Smokeless tobacco: Never Used  . Alcohol use No    Allergies as of 12/19/2016 - Review Complete 12/19/2016  Allergen Reaction Noted  . Codeine Nausea And Vomiting 05/01/2012    Review of Systems:  All systems reviewed and negative except where noted in HPI.   Physical Exam:  BP (!) 142/82   Pulse (!) 58   Temp 98.4 F (36.9 C) (Oral)   Ht 5\' 7"  (1.702 m)   Wt 260 lb (117.9 kg)   LMP 08/04/2014   BMI 40.72 kg/m  Patient's last menstrual period was 08/04/2014. Psych:  Alert and cooperative. Normal mood and affect. General:   Alert,  Well-developed, well-nourished, pleasant and cooperative in NAD Head:  Normocephalic and atraumatic. Eyes:  Sclera clear, no icterus.   Conjunctiva pink. Ears:  Normal auditory acuity. Nose:  No deformity, discharge, or lesions. Mouth:  No deformity or lesions,oropharynx pink &  moist. Neck:  Supple; no masses or thyromegaly. Lungs:  Respirations even and unlabored.  Clear throughout to auscultation.   No wheezes, crackles, or rhonchi. No acute distress. Heart:  Regular rate and rhythm; no murmurs, clicks, rubs, or gallops. Abdomen:  Normal bowel sounds.  No bruits.  Soft, non-tender and non-distended without masses, hepatosplenomegaly or hernias noted.  No guarding or rebound tenderness.  Negative Carnett sign.   Rectal:  Deferred.  Msk:  Symmetrical without gross deformities.  Good, equal movement & strength bilaterally. Pulses:  Normal pulses noted. Extremities:  No clubbing or edema.  No cyanosis. Neurologic:  Alert and oriented x3;  grossly normal neurologically. Skin:  Intact without significant lesions or rashes.  No jaundice. Lymph Nodes:  No significant cervical adenopathy. Psych:  Alert and cooperative. Normal mood and affect.  Imaging Studies: Dg Knee Ap/lat W/sunrise Right  Result Date: 12/04/2016 CLINICAL DATA:  Right knee pain EXAM: RIGHT KNEE 3 VIEWS COMPARISON:  06/17/2015 FINDINGS: Four views of the right knee submitted. Again noted postsurgical changes post ACL repair. Again noted degenerative changes with joint space narrowing. There is spurring of right lateral femoral condyle and medial tibial plateau. Narrowing of patellofemoral joint space. Small joint effusion. Spurring of patella. No acute fracture or subluxation. IMPRESSION: No acute fracture or subluxation. Stable postsurgical changes post ACL repair. Again noted osteoarthritic changes as described above with slight progression from prior exam. Small joint effusion. Electronically Signed   By: Lahoma Crocker M.D.   On: 12/04/2016 10:22    Assessment and Plan:   Jill Cruz is a 51 y.o. y/o female who comes in today with a change in bowel habits with loose stools for the last 9-10 months. The patient has been told to avoid dairy products including ice cream and cheese for one week to see if  her symptoms improve. Patient will also be set up for colonoscopy since she has a change in bowel habits and has not had a colonoscopy in the past. I have discussed risks & benefits which include, but are not limited to, bleeding, infection, perforation & drug reaction.  The patient agrees with this plan & written consent will be obtained.       Lucilla Lame, MD. Marval Regal   Note: This dictation was prepared with Dragon dictation along with smaller phrase technology. Any transcriptional errors that result from this process are unintentional.

## 2016-12-20 ENCOUNTER — Other Ambulatory Visit: Payer: Self-pay

## 2016-12-20 DIAGNOSIS — Z1211 Encounter for screening for malignant neoplasm of colon: Secondary | ICD-10-CM

## 2017-01-01 ENCOUNTER — Encounter: Payer: Self-pay | Admitting: *Deleted

## 2017-01-02 ENCOUNTER — Ambulatory Visit: Payer: 59 | Admitting: Anesthesiology

## 2017-01-02 ENCOUNTER — Ambulatory Visit
Admission: RE | Admit: 2017-01-02 | Discharge: 2017-01-02 | Disposition: A | Discharge status: Home / Self Care | Payer: 59 | Source: Ambulatory Visit | Attending: Gastroenterology | Admitting: Gastroenterology

## 2017-01-02 ENCOUNTER — Encounter: Payer: Self-pay | Admitting: *Deleted

## 2017-01-02 ENCOUNTER — Telehealth: Payer: Self-pay | Admitting: Gastroenterology

## 2017-01-02 ENCOUNTER — Encounter
Admission: RE | Disposition: A | Discharge status: Home / Self Care | Payer: Self-pay | Source: Ambulatory Visit | Attending: Gastroenterology

## 2017-01-02 DIAGNOSIS — Z1211 Encounter for screening for malignant neoplasm of colon: Secondary | ICD-10-CM | POA: Diagnosis not present

## 2017-01-02 DIAGNOSIS — K529 Noninfective gastroenteritis and colitis, unspecified: Secondary | ICD-10-CM | POA: Insufficient documentation

## 2017-01-02 DIAGNOSIS — K219 Gastro-esophageal reflux disease without esophagitis: Secondary | ICD-10-CM | POA: Diagnosis not present

## 2017-01-02 DIAGNOSIS — Z833 Family history of diabetes mellitus: Secondary | ICD-10-CM | POA: Diagnosis not present

## 2017-01-02 DIAGNOSIS — E7212 Methylenetetrahydrofolate reductase deficiency: Secondary | ICD-10-CM | POA: Diagnosis not present

## 2017-01-02 DIAGNOSIS — Z885 Allergy status to narcotic agent status: Secondary | ICD-10-CM | POA: Insufficient documentation

## 2017-01-02 DIAGNOSIS — I10 Essential (primary) hypertension: Secondary | ICD-10-CM | POA: Diagnosis not present

## 2017-01-02 DIAGNOSIS — D122 Benign neoplasm of ascending colon: Secondary | ICD-10-CM | POA: Insufficient documentation

## 2017-01-02 DIAGNOSIS — F419 Anxiety disorder, unspecified: Secondary | ICD-10-CM | POA: Insufficient documentation

## 2017-01-02 DIAGNOSIS — K635 Polyp of colon: Secondary | ICD-10-CM | POA: Diagnosis not present

## 2017-01-02 DIAGNOSIS — Z8261 Family history of arthritis: Secondary | ICD-10-CM | POA: Insufficient documentation

## 2017-01-02 DIAGNOSIS — Z79899 Other long term (current) drug therapy: Secondary | ICD-10-CM | POA: Insufficient documentation

## 2017-01-02 DIAGNOSIS — Z9049 Acquired absence of other specified parts of digestive tract: Secondary | ICD-10-CM | POA: Insufficient documentation

## 2017-01-02 DIAGNOSIS — Z8249 Family history of ischemic heart disease and other diseases of the circulatory system: Secondary | ICD-10-CM | POA: Diagnosis not present

## 2017-01-02 HISTORY — PX: COLONOSCOPY WITH PROPOFOL: SHX5780

## 2017-01-02 SURGERY — COLONOSCOPY WITH PROPOFOL
Anesthesia: General

## 2017-01-02 MED ORDER — SODIUM CHLORIDE 0.9 % IV SOLN
INTRAVENOUS | Status: DC
Start: 2017-01-02 — End: 2017-01-02
  Administered 2017-01-02: 1000 mL via INTRAVENOUS

## 2017-01-02 MED ORDER — LIDOCAINE HCL (PF) 2 % IJ SOLN
INTRAMUSCULAR | Status: AC
  Filled 2017-01-02: qty 2

## 2017-01-02 MED ORDER — PROPOFOL 10 MG/ML IV BOLUS
INTRAVENOUS | Status: DC | PRN
  Administered 2017-01-02: 100 mg via INTRAVENOUS

## 2017-01-02 MED ORDER — PROPOFOL 500 MG/50ML IV EMUL
INTRAVENOUS | Status: DC | PRN
  Administered 2017-01-02: 125 ug/kg/min via INTRAVENOUS

## 2017-01-02 MED ORDER — LIDOCAINE HCL (CARDIAC) 20 MG/ML IV SOLN
INTRAVENOUS | Status: DC | PRN
  Administered 2017-01-02: 100 mg via INTRAVENOUS

## 2017-01-02 MED ORDER — PROPOFOL 500 MG/50ML IV EMUL
INTRAVENOUS | Status: AC
  Filled 2017-01-02: qty 50

## 2017-01-02 NOTE — H&P (Signed)
Jill Lame, MD [address] [place], [place] [address] [phone] Fax : [phone]  Primary Care Physician:  Arnette Norris, MD Primary Gastroenterologist:  Dr. Allen Norris  Pre-Procedure History & Physical: HPI:  Jill Cruz is a 51 y.o. female is here for an colonoscopy.   Past Medical History:  Diagnosis Date  . Anxiety 2011   Post Partum Anxiety/Depression treated w/ medication. Now resolved.  Marland Kitchen GERD (gastroesophageal reflux disease)    Mild baseline- worse w/pregnancy. Takes TUMS prn.  . History of pre-eclampsia in prior pregnancy, currently pregnant   . Hypertension    On Labetalol  . MTHFR mutation ([place]) 03/27/2013  . PONV (postoperative nausea and vomiting)   . Postpartum care following cesarean delivery (03/27/13) 03/27/2013  . S/P repeat low transverse C-section 03/30/2013    Past Surgical History:  Procedure Laterality Date  . CESAREAN SECTION  2011   at [facility]  . CESAREAN SECTION N/A 03/27/2013   Procedure: REPEAT CESAREAN SECTION;  Surgeon: Claiborne Billings A. Pamala Hurry, MD;  Location: [facility];  Service: Obstetrics;  Laterality: N/A;  Repeat C/S  EDD: 04/23/13  . CHOLECYSTECTOMY  ~2003   [facility] in [place]  . DIAGNOSTIC LAPAROSCOPY Left ~2008   Ovarian Cyst removed- [facility]. in [place]  . KNEE ARTHROSCOPY W/ ACL RECONSTRUCTION AND PATELLA GRAFT Right 2000   [facility]. in [place]  . KNEE ARTHROSCOPY W/ PARTIAL MEDIAL MENISCECTOMY Right 2000    Prior to Admission medications   Medication Sig Start Date End Date Taking? Authorizing Provider  CVS B-12 1000 MCG/15ML LIQD  08/14/14  Yes Historical Provider, MD  folic acid (FOLVITE) 1 MG tablet TAKE 4 TABLETS BY MOUTH EVERY DAY 09/18/16  Yes Lucille Passy, MD  lisinopril (PRINIVIL,ZESTRIL) 10 MG tablet TAKE 1 TABLET (10 MG TOTAL) BY MOUTH DAILY. 09/05/16  Yes Lucille Passy, MD  nystatin-triamcinolone (MYCOLOG II) cream  11/07/16  Yes Historical Provider, MD  sertraline (ZOLOFT) 25 MG tablet Take 1 tablet (25 mg  total) by mouth daily. 04/14/16  Yes Lucille Passy, MD    Allergies as of 12/20/2016 - Review Complete 12/19/2016  Allergen Reaction Noted  . Codeine Nausea And Vomiting 05/01/2012    Family History  Problem Relation Age of Onset  . Hypertension Mother   . Diabetes Mother   . Arthritis Mother   . Arthritis Father     Social History   Social History  . Marital status: Married    Spouse name: N/A  . Number of children: N/A  . Years of education: N/A   Occupational History  . Not on file.   Social History Main Topics  . Smoking status: Never Smoker  . Smokeless tobacco: Never Used  . Alcohol use No  . Drug use: No  . Sexual activity: Yes   Other Topics Concern  . Not on file   Social History Narrative   Married.   One son, [name] (age 4).   [redacted] weeks pregnant- 11/2011.   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    Review of Systems: See HPI, otherwise negative ROS  Physical Exam: BP (!) 145/101   Pulse 74   Temp (!) 96.9 F (36.1 C) (Tympanic)   Resp 20   LMP 08/04/2014   SpO2 98%  General:   Alert,  pleasant and cooperative in NAD Head:  Normocephalic and atraumatic. Neck:  Supple; no masses or thyromegaly. Lungs:  Clear throughout to auscultation.    Heart:  Regular rate and rhythm.  Abdomen:  Soft, nontender and nondistended. Normal bowel sounds, without guarding, and without rebound.   Neurologic:  Alert and  oriented x4;  grossly normal neurologically.  Impression/Plan: Jill Cruz is here for an colonoscopy to be performed for diarrhea  Risks, benefits, limitations, and alternatives regarding  colonoscopy have been reviewed with the patient.  Questions have been answered.  All parties agreeable.   Jill Lame, MD  01/02/2017, 8:49 AM

## 2017-01-02 NOTE — Anesthesia Preprocedure Evaluation (Signed)
Anesthesia Evaluation  Patient identified by MRN, date of birth, ID band Patient awake    Reviewed: Allergy & Precautions, H&P , NPO status , Patient's Chart, lab work & pertinent test results, reviewed documented beta blocker date and time   History of Anesthesia Complications (+) PONV and history of anesthetic complications  Airway Mallampati: III  TM Distance: >3 FB Neck ROM: full    Dental  (+) Implants   Pulmonary neg pulmonary ROS,           Cardiovascular Exercise Tolerance: Good hypertension, (-) angina(-) CAD, (-) Past MI, (-) Cardiac Stents and (-) CABG (-) dysrhythmias (-) Valvular Problems/Murmurs     Neuro/Psych PSYCHIATRIC DISORDERS (Anxiety) negative neurological ROS     GI/Hepatic Neg liver ROS, GERD  ,  Endo/Other  neg diabetesMorbid obesity  Renal/GU negative Renal ROS  negative genitourinary   Musculoskeletal   Abdominal   Peds  Hematology negative hematology ROS (+)   Anesthesia Other Findings Past Medical History: 2011: Anxiety     Comment: Post Partum Anxiety/Depression treated w/               medication. Now resolved. No date: GERD (gastroesophageal reflux disease)     Comment: Mild baseline- worse w/pregnancy. Takes TUMS               prn. No date: History of pre-eclampsia in prior pregnancy, c* No date: Hypertension     Comment: On Labetalol 03/27/2013: MTHFR mutation ([place]) No date: PONV (postoperative nausea and vomiting) 03/27/2013: Postpartum care following cesarean delivery (6* 03/30/2013: S/P repeat low transverse C-section   Reproductive/Obstetrics negative OB ROS                             Anesthesia Physical Anesthesia Plan  ASA: III  Anesthesia Plan: General   Post-op Pain Management:    Induction:   Airway Management Planned:   Additional Equipment:   Intra-op Plan:   Post-operative Plan:   Informed Consent: I have reviewed the  patients History and Physical, chart, labs and discussed the procedure including the risks, benefits and alternatives for the proposed anesthesia with the patient or authorized representative who has indicated his/her understanding and acceptance.   Dental Advisory Given  Plan Discussed with: Anesthesiologist, CRNA and Surgeon  Anesthesia Plan Comments:         Anesthesia Quick Evaluation

## 2017-01-02 NOTE — Anesthesia Postprocedure Evaluation (Signed)
Anesthesia Post Note  Patient: Jill Cruz  Procedure(s) Performed: Procedure(s) (LRB): COLONOSCOPY WITH PROPOFOL (N/A)  Patient location during evaluation: Endoscopy Anesthesia Type: General Level of consciousness: awake and alert Pain management: pain level controlled Vital Signs Assessment: post-procedure vital signs reviewed and stable Respiratory status: spontaneous breathing, nonlabored ventilation, respiratory function stable and patient connected to nasal cannula oxygen Cardiovascular status: blood pressure returned to baseline and stable Postop Assessment: no signs of nausea or vomiting Anesthetic complications: no     Last Vitals:  Vitals:   01/02/17 1022 01/02/17 1032  BP: (!) 147/96   Pulse: (!) 53 (!) 56  Resp: 16 20  Temp:      Last Pain:  Vitals:   01/02/17 1032  TempSrc:   PainSc: 0-No pain                 [name]

## 2017-01-02 NOTE — Anesthesia Post-op Follow-up Note (Signed)
Anesthesia QCDR form completed.        

## 2017-01-02 NOTE — Transfer of Care (Signed)
Immediate Anesthesia Transfer of Care Note  Patient: Jill Cruz  Procedure(s) Performed: Procedure(s): COLONOSCOPY WITH PROPOFOL (N/A)  Patient Location: Endoscopy Unit  Anesthesia Type:General  Level of Consciousness: awake and alert   Airway & Oxygen Therapy: Patient Spontanous Breathing and Patient connected to nasal cannula oxygen  Post-op Assessment: Report given to RN and Post -op Vital signs reviewed and stable  Post vital signs: Reviewed and stable  Last Vitals:  Vitals:   01/02/17 0828  BP: (!) 145/101  Pulse: 74  Resp: 20  Temp: (!) 36.1 C    Last Pain:  Vitals:   01/02/17 0828  TempSrc: Tympanic         Complications: No apparent anesthesia complications

## 2017-01-02 NOTE — Op Note (Signed)
[facility] Gastroenterology Patient Name: Jill Cruz Procedure Date: 01/02/2017 9:25 AM MRN: [id number] Account #: [id number] Date of Birth: 11/21/1965 Admit Type: Outpatient Age: 51 Room: [facility] ENDO ROOM 4 Gender: Female Note Status: Finalized Procedure:            Colonoscopy Indications:          Chronic diarrhea Providers:            Lucilla Lame MD, MD Referring MD:         Marciano Sequin. Deborra Medina (Referring MD) Medicines:            Propofol per Anesthesia Complications:        No immediate complications. Procedure:            Pre-Anesthesia Assessment:                       - Prior to the procedure, a History and Physical was                        performed, and patient medications and allergies were                        reviewed. The patient's tolerance of previous                        anesthesia was also reviewed. The risks and benefits of                        the procedure and the sedation options and risks were                        discussed with the patient. All questions were                        answered, and informed consent was obtained. Prior                        Anticoagulants: The patient has taken no previous                        anticoagulant or antiplatelet agents. ASA Grade                        Assessment: II - A patient with mild systemic disease.                        After reviewing the risks and benefits, the patient was                        deemed in satisfactory condition to undergo the                        procedure.                       After obtaining informed consent, the colonoscope was                        passed under direct vision. Throughout the procedure,  the patient's blood pressure, pulse, and oxygen                        saturations were monitored continuously. The Olympus                        CF-HQ190L Colonoscope (S#. [id number]) was introduced                        through  the anus and advanced to the the terminal                        ileum. The colonoscopy was performed without                        difficulty. The patient tolerated the procedure well.                        The quality of the bowel preparation was excellent. Findings:      The perianal and digital rectal examinations were normal.      The terminal ileum appeared normal. Biopsies were taken with a cold       forceps for histology.      A 4 mm polyp was found in the ascending colon. The polyp was sessile.       The polyp was removed with a cold biopsy forceps. Resection and       retrieval were complete.      Biopsies were taken with a cold forceps in the entire colon for       histology. Random biopsies were obtained with cold forceps for histology       randomly in the entire colon. Impression:           - The examined portion of the ileum was normal.                        Biopsied.                       - One 4 mm polyp in the ascending colon, removed with a                        cold biopsy forceps. Resected and retrieved.                       - Biopsies were taken with a cold forceps for histology                        in the entire colon.                       - Random biopsies were obtained in the entire colon. Recommendation:       - Discharge patient to home.                       - Resume previous diet.                       - Continue present medications.                       -  Await pathology results. Procedure Code(s):    --- Professional ---                       [phone], Colonoscopy, flexible; with biopsy, single or                        multiple Diagnosis Code(s):    --- Professional ---                       K52.9, Noninfective gastroenteritis and colitis,                        unspecified                       D12.2, Benign neoplasm of ascending colon CPT copyright 2016 American Medical Association. All rights reserved. The codes documented in this report are  preliminary and upon coder review may  be revised to meet current compliance requirements. Lucilla Lame MD, MD 01/02/2017 9:49:39 AM This report has been signed electronically. Number of Addenda: 0 Note Initiated On: 01/02/2017 9:25 AM Scope Withdrawal Time: 0 hours 6 minutes 47 seconds  Total Procedure Duration: 0 hours 9 minutes 31 seconds       [facility]

## 2017-01-02 NOTE — Telephone Encounter (Signed)
Prior Auth# [id number] from Charlynn Grimes with [facility] . Screening colonoscopy [phone] / Z12.11 must fax clinicals to [phone].

## 2017-01-03 ENCOUNTER — Encounter: Payer: Self-pay | Admitting: Gastroenterology

## 2017-01-03 LAB — SURGICAL PATHOLOGY

## 2017-01-05 ENCOUNTER — Telehealth: Payer: Self-pay

## 2017-01-05 NOTE — Telephone Encounter (Signed)
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 The patient did not have any abnormalities on the random biopsies to explain her diarrhea. If the patient is still having diarrhea please have her follow-up in the office.

## 2017-01-05 NOTE — Telephone Encounter (Signed)
LVM for pt to return my call.

## 2017-01-05 NOTE — Telephone Encounter (Signed)
Pt returned call and was notified of results. Pt is still having diarrhea but would like to keep a food diary first to see if she can find the cause. She feels this is food related.

## 2017-01-08 ENCOUNTER — Other Ambulatory Visit: Payer: Self-pay | Admitting: Family Medicine

## 2017-03-03 ENCOUNTER — Other Ambulatory Visit: Payer: Self-pay | Admitting: Family Medicine

## 2017-03-12 ENCOUNTER — Other Ambulatory Visit: Payer: Self-pay | Admitting: Family Medicine

## 2017-03-30 DIAGNOSIS — Z01419 Encounter for gynecological examination (general) (routine) without abnormal findings: Secondary | ICD-10-CM | POA: Diagnosis not present

## 2017-03-30 DIAGNOSIS — Z1231 Encounter for screening mammogram for malignant neoplasm of breast: Secondary | ICD-10-CM | POA: Diagnosis not present

## 2017-04-14 DIAGNOSIS — J209 Acute bronchitis, unspecified: Secondary | ICD-10-CM | POA: Diagnosis not present

## 2017-04-14 DIAGNOSIS — J0100 Acute maxillary sinusitis, unspecified: Secondary | ICD-10-CM | POA: Diagnosis not present

## 2017-04-25 DIAGNOSIS — R5381 Other malaise: Secondary | ICD-10-CM | POA: Diagnosis not present

## 2017-04-25 DIAGNOSIS — R5383 Other fatigue: Secondary | ICD-10-CM | POA: Diagnosis not present

## 2017-05-18 ENCOUNTER — Other Ambulatory Visit: Payer: Self-pay

## 2017-05-18 DIAGNOSIS — Z13810 Encounter for screening for upper gastrointestinal disorder: Secondary | ICD-10-CM

## 2017-05-25 ENCOUNTER — Telehealth: Payer: Self-pay | Admitting: Gastroenterology

## 2017-05-25 ENCOUNTER — Other Ambulatory Visit: Payer: Self-pay

## 2017-05-25 NOTE — Telephone Encounter (Signed)
Patient received a letter in the mail to call and schedule an EGD. Please call

## 2017-05-25 NOTE — Telephone Encounter (Signed)
Pt scheduled for an EGD with [facility] on 06/01/17.

## 2017-05-28 ENCOUNTER — Telehealth: Payer: Self-pay | Admitting: Gastroenterology

## 2017-05-28 ENCOUNTER — Encounter: Payer: Self-pay | Admitting: *Deleted

## 2017-05-28 NOTE — Telephone Encounter (Signed)
Patient LVM and is ready to schedule her procedure.

## 2017-05-29 ENCOUNTER — Encounter: Payer: Self-pay | Admitting: Anesthesiology

## 2017-05-29 NOTE — Telephone Encounter (Signed)
Duplicate message. Pt is already scheduled for an EGD on 06/01/17.

## 2017-05-31 ENCOUNTER — Telehealth: Payer: Self-pay | Admitting: Gastroenterology

## 2017-05-31 NOTE — Telephone Encounter (Signed)
Noted pt cancelled procedure.

## 2017-05-31 NOTE — Telephone Encounter (Signed)
Patient called to cancel her EGD tomorrow due to her weight being up. She needs to call her insurance to see if it will still be covered. She will call us back to reschedule in a couple of months. I called [facility] to let them know

## 2017-06-01 ENCOUNTER — Ambulatory Visit: Admission: RE | Admit: 2017-06-01 | Payer: 59 | Source: Ambulatory Visit | Admitting: Gastroenterology

## 2017-06-01 HISTORY — DX: Family history of other specified conditions: Z84.89

## 2017-06-01 HISTORY — DX: Motion sickness, initial encounter: T75.3XXA

## 2017-06-01 SURGERY — ESOPHAGOGASTRODUODENOSCOPY (EGD) WITH PROPOFOL
Anesthesia: Choice

## 2017-07-13 DIAGNOSIS — Z23 Encounter for immunization: Secondary | ICD-10-CM | POA: Diagnosis not present

## 2017-10-15 ENCOUNTER — Other Ambulatory Visit: Payer: Self-pay

## 2017-10-15 MED ORDER — SERTRALINE HCL 25 MG PO TABS: 25.0000 mg | ORAL_TABLET | Freq: Every day | ORAL | 0 refills | Status: DC

## 2017-10-23 ENCOUNTER — Encounter: Payer: Self-pay | Admitting: Family Medicine

## 2017-10-23 ENCOUNTER — Ambulatory Visit: Payer: 59 | Admitting: Family Medicine

## 2017-10-23 VITALS — BP 110/72 | HR 91 | Temp 98.8°F | Ht 67.0 in | Wt 239.6 lb

## 2017-10-23 DIAGNOSIS — R070 Pain in throat: Secondary | ICD-10-CM | POA: Diagnosis not present

## 2017-10-23 DIAGNOSIS — J189 Pneumonia, unspecified organism: Secondary | ICD-10-CM | POA: Insufficient documentation

## 2017-10-23 DIAGNOSIS — J181 Lobar pneumonia, unspecified organism: Secondary | ICD-10-CM

## 2017-10-23 LAB — POCT RAPID STREP A (OFFICE): Rapid Strep A Screen: NEGATIVE

## 2017-10-23 MED ORDER — AZITHROMYCIN 250 MG PO TABS: ORAL_TABLET | ORAL | 0 refills | Status: DC

## 2017-10-23 MED ORDER — ONDANSETRON 8 MG PO TBDP: 8.0000 mg | ORAL_TABLET | Freq: Three times a day (TID) | ORAL | 0 refills | Status: DC | PRN

## 2017-10-23 NOTE — Assessment & Plan Note (Signed)
zpack as directed. zofran as needed for nausea. Push fluids. Call or return to clinic prn if these symptoms worsen or fail to improve as anticipated. The patient indicates understanding of these issues and agrees with the plan.

## 2017-10-23 NOTE — Patient Instructions (Signed)
Great to see you.  Please take zpack as directed.  zofran as needed.

## 2017-10-23 NOTE — Progress Notes (Signed)
Subjective:   Patient ID: Jill Cruz, female    DOB: Oct 27, 1965, 52 y.o.   MRN: [id number]  Jill Cruz is a pleasant 51 y.o. year old female who presents to clinic today with Sore Throat (Patient started with throat pain on 1.13.19.  She has had additional Sx laryngitis, congestion with productive cough with yellow sputum.  Last hs at 12pm started throwing up and diarrhea.)  on 10/23/2017  HPI:  Sore throat started 9 days ago.  Also having nasal congestion, productive cough, laryngitis.  Started vomiting and having diarrhea yesterday.    Current Outpatient Medications on File Prior to Visit  Medication Sig Dispense Refill  . Cholecalciferol (VITAMIN D PO) Take by mouth daily.    . [employer] B-12 1000 PFX/90WI LIQD   0  . folic acid (FOLVITE) 1 MG tablet TAKE 4 TABLETS BY MOUTH EVERY DAY 120 tablet 4  . lisinopril (PRINIVIL,ZESTRIL) 10 MG tablet TAKE 1 TABLET (10 MG TOTAL) BY MOUTH DAILY. 90 tablet 2  . nystatin-triamcinolone (MYCOLOG II) cream     . sertraline (ZOLOFT) 25 MG tablet Take 1 tablet (25 mg total) by mouth daily. 90 tablet 0   No current facility-administered medications on file prior to visit.     Allergies  Allergen Reactions  . Codeine Nausea And Vomiting    Past Medical History:  Diagnosis Date  . Anxiety 2011   Post Partum Anxiety/Depression treated w/ medication. Now resolved.  . Family history of adverse reaction to anesthesia    Maternal aunt - PONV  . GERD (gastroesophageal reflux disease)    Mild baseline- worse w/pregnancy. Takes TUMS prn.  . History of pre-eclampsia in prior pregnancy, currently pregnant   . Hypertension    On Labetalol  . Motion sickness    cars, boats  . MTHFR mutation ([place]) 03/27/2013  . PONV (postoperative nausea and vomiting)    OK after colonoscopy with propofol  . Postpartum care following cesarean delivery (03/27/13) 03/27/2013  . S/P repeat low transverse C-section 03/30/2013    Past Surgical History:    Procedure Laterality Date  . CESAREAN SECTION  2011   at [facility]  . CESAREAN SECTION N/A 03/27/2013   Procedure: REPEAT CESAREAN SECTION;  Surgeon: Claiborne Billings A. Pamala Hurry, MD;  Location: [facility];  Service: Obstetrics;  Laterality: N/A;  Repeat C/S  EDD: 04/23/13  . CHOLECYSTECTOMY  ~2003   [facility] in [place]  . COLONOSCOPY WITH PROPOFOL N/A 01/02/2017   Procedure: COLONOSCOPY WITH PROPOFOL;  Surgeon: Lucilla Lame, MD;  Location: [facility] ENDOSCOPY;  Service: Endoscopy;  Laterality: N/A;  . DIAGNOSTIC LAPAROSCOPY Left ~2008   Ovarian Cyst removed- [facility]. in [place]  . KNEE ARTHROSCOPY W/ ACL RECONSTRUCTION AND PATELLA GRAFT Right 2000   [facility]. in [place]  . KNEE ARTHROSCOPY W/ PARTIAL MEDIAL MENISCECTOMY Right 2000    Family History  Problem Relation Age of Onset  . Hypertension Mother   . Diabetes Mother   . Arthritis Mother   . Arthritis Father     Social History   Socioeconomic History  . Marital status: Married    Spouse name: Not on file  . Number of children: Not on file  . Years of education: Not on file  . Highest education level: Not on file  Social Needs  . Financial resource strain: Not on file  . Food insecurity - worry: Not on file  . Food insecurity - inability: Not on file  . Transportation needs - medical: Not on  file  . Transportation needs - non-medical: Not on file  Occupational History  . Not on file  Tobacco Use  . Smoking status: Never Smoker  . Smokeless tobacco: Never Used  Substance and Sexual Activity  . Alcohol use: No    Alcohol/week: 0.0 oz  . Drug use: No  . Sexual activity: Yes  Other Topics Concern  . Not on file  Social History Narrative   Married.   One son, [name] (age 66).   [redacted] weeks pregnant- 11/2011.   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   The PMH, PSH, Social History, Family History, Medications, and allergies have been reviewed in [facility], and have been updated if relevant.   Review of Systems  Constitutional: Negative.   HENT: Positive  for congestion, postnasal drip, rhinorrhea and sore throat.   Respiratory: Positive for cough. Negative for shortness of breath.   Cardiovascular: Negative.   Gastrointestinal: Positive for diarrhea, nausea and vomiting.  Genitourinary: Negative.   Musculoskeletal: Negative.   Skin: Negative.   Neurological: Negative.   Hematological: Negative.   Psychiatric/Behavioral: Negative.   All other systems reviewed and are negative.      Objective:    BP 110/72 (BP Location: Left Arm, Patient Position: Sitting, Cuff Size: Normal)   Pulse 91   Temp 98.8 F (37.1 C) (Oral)   Ht 5\' 7"  (1.702 m)   Wt 239 lb 9.6 oz (108.7 kg)   LMP 08/04/2014   SpO2 95%   BMI 37.53 kg/m    Physical Exam  Constitutional: She is oriented to person, place, and time. She appears well-developed and well-nourished. No distress.  HENT:  Head: Normocephalic and atraumatic.  Right Ear: Hearing, tympanic membrane and external ear normal.  Left Ear: Hearing, tympanic membrane and external ear normal.  Nose: Rhinorrhea present. Right sinus exhibits no maxillary sinus tenderness and no frontal sinus tenderness. Left sinus exhibits no maxillary sinus tenderness and no frontal sinus tenderness.  Mouth/Throat: Uvula is midline and oropharynx is clear and moist.  Pulmonary/Chest: She has rhonchi in the left lower field. She has rales in the left lower field.  Musculoskeletal: Normal range of motion. She exhibits no edema.  Neurological: She is alert and oriented to person, place, and time. No cranial nerve deficit.  Skin: Skin is warm and dry. She is not diaphoretic.  Psychiatric: She has a normal mood and affect. Her behavior is normal. Judgment and thought content normal.  Nursing note and vitals reviewed.         Assessment & Plan:   Throat pain in adult - Plan: POCT rapid strep A  Community acquired pneumonia of left lower lobe of lung ([place]) No Follow-up on file.

## 2017-11-11 DIAGNOSIS — R05 Cough: Secondary | ICD-10-CM | POA: Diagnosis not present

## 2017-11-11 DIAGNOSIS — J0191 Acute recurrent sinusitis, unspecified: Secondary | ICD-10-CM | POA: Diagnosis not present

## 2017-11-11 DIAGNOSIS — R091 Pleurisy: Secondary | ICD-10-CM | POA: Diagnosis not present

## 2017-11-21 ENCOUNTER — Other Ambulatory Visit: Payer: Self-pay

## 2017-11-22 ENCOUNTER — Encounter: Payer: Self-pay | Admitting: Family Medicine

## 2017-11-22 ENCOUNTER — Ambulatory Visit (INDEPENDENT_AMBULATORY_CARE_PROVIDER_SITE_OTHER): Payer: 59 | Admitting: Family Medicine

## 2017-11-22 VITALS — BP 126/80 | HR 63 | Temp 98.4°F | Ht 67.5 in | Wt 248.4 lb

## 2017-11-22 DIAGNOSIS — R5383 Other fatigue: Secondary | ICD-10-CM | POA: Diagnosis not present

## 2017-11-22 DIAGNOSIS — Z23 Encounter for immunization: Secondary | ICD-10-CM | POA: Diagnosis not present

## 2017-11-22 DIAGNOSIS — I10 Essential (primary) hypertension: Secondary | ICD-10-CM

## 2017-11-22 DIAGNOSIS — E782 Mixed hyperlipidemia: Secondary | ICD-10-CM | POA: Diagnosis not present

## 2017-11-22 DIAGNOSIS — Z0000 Encounter for general adult medical examination without abnormal findings: Secondary | ICD-10-CM | POA: Diagnosis not present

## 2017-11-22 LAB — LIPID PANEL
CHOL/HDL RATIO: 3
CHOLESTEROL: 144 mg/dL (ref 0–200)
HDL: 48 mg/dL (ref >39.00)
LDL CALC: 75 mg/dL (ref 0–99)
NonHDL: 95.74
TRIGLYCERIDES: 104 mg/dL (ref 0.0–149.0)
VLDL: 20.8 mg/dL (ref 0.0–40.0)

## 2017-11-22 LAB — COMPREHENSIVE METABOLIC PANEL
ALBUMIN: 4 g/dL (ref 3.5–5.2)
ALT: 25 U/L (ref 0–35)
AST: 20 U/L (ref 0–37)
Alkaline Phosphatase: 63 U/L (ref 39–117)
BUN: 14 mg/dL (ref 6–23)
CALCIUM: 9.4 mg/dL (ref 8.4–10.5)
CHLORIDE: 104 meq/L (ref 96–112)
CO2: 27 meq/L (ref 19–32)
Creatinine, Ser: 0.62 mg/dL (ref 0.40–1.20)
GFR: 107.75 mL/min (ref >60.00)
Glucose, Bld: 77 mg/dL (ref 70–99)
POTASSIUM: 4.2 meq/L (ref 3.5–5.1)
SODIUM: 138 meq/L (ref 135–145)
Total Bilirubin: 0.5 mg/dL (ref 0.2–1.2)
Total Protein: 6.9 g/dL (ref 6.0–8.3)

## 2017-11-22 LAB — CBC WITH DIFFERENTIAL/PLATELET
BASOS PCT: 0.5 % (ref 0.0–3.0)
Basophils Absolute: 0 10*3/uL (ref 0.0–0.1)
EOS ABS: 0.2 10*3/uL (ref 0.0–0.7)
EOS PCT: 2 % (ref 0.0–5.0)
HEMATOCRIT: 40.2 % (ref 36.0–46.0)
Hemoglobin: 13.4 g/dL (ref 12.0–15.0)
Lymphocytes Relative: 29.9 % (ref 12.0–46.0)
Lymphs Abs: 2.2 10*3/uL (ref 0.7–4.0)
MCHC: 33.4 g/dL (ref 30.0–36.0)
MCV: 87.4 fl (ref 78.0–100.0)
MONO ABS: 0.5 10*3/uL (ref 0.1–1.0)
Monocytes Relative: 7.1 % (ref 3.0–12.0)
NEUTROS ABS: 4.5 10*3/uL (ref 1.4–7.7)
Neutrophils Relative %: 60.5 % (ref 43.0–77.0)
PLATELETS: 289 10*3/uL (ref 150.0–400.0)
RBC: 4.6 Mil/uL (ref 3.87–5.11)
RDW: 14 % (ref 11.5–15.5)
WBC: 7.4 10*3/uL (ref 4.0–10.5)

## 2017-11-22 LAB — TSH: TSH: 1 u[IU]/mL (ref 0.35–4.50)

## 2017-11-22 NOTE — Patient Instructions (Signed)
Great to see you. I will call you with your lab results from today and you can view them online.   

## 2017-11-22 NOTE — Assessment & Plan Note (Signed)
Reviewed preventive care protocols, scheduled due services, and updated immunizations Discussed nutrition, exercise, diet, and healthy lifestyle.  Orders Placed This Encounter  Procedures  . Tdap vaccine greater than or equal to 52yo IM  . CBC with Differential/Platelet  . Comprehensive metabolic panel  . Lipid panel  . TSH    

## 2017-11-22 NOTE — Progress Notes (Signed)
Subjective:   Patient ID: Jill Cruz, female    DOB: 11-02-1965, 52 y.o.   MRN: [id number]  Jill Cruz is a pleasant 52 y.o. year old female who presents to clinic today with Annual Exam (Patient is here today for a CPE. She is currently fasting.  She states that she had a Mammogram late 2018 @ [facility] and will get them to fax it over.  She agrees to get the Tdap today.)  on 11/22/2017  HPI:  Health Maintenance  Topic Date Due  . MAMMOGRAM  03/25/2017  . TETANUS/TDAP  11/06/2017  . PAP SMEAR  07/20/2019  . COLONOSCOPY  01/03/2027  . INFLUENZA VACCINE  Completed  . HIV Screening  Completed    Has GYN- Dr. Erroll Luna. Per pt Pap smear, pelvic US and mammogram done at her office a few months ago.  All benign.  Colonoscopy 01/02/17  Feels zoloft is working well at current dose.  BP has been well controlled with low dose lisinopril.   Lab Results  Component Value Date   WBC 7.1 11/06/2016   HGB 14.2 11/06/2016   HCT 42.0 11/06/2016   MCV 86.8 11/06/2016   PLT 290.0 11/06/2016   Lab Results  Component Value Date   TSH 0.69 11/06/2016   Lab Results  Component Value Date   CHOL 140 11/06/2016   HDL 49.10 11/06/2016   LDLCALC 70 11/06/2016   TRIG 101.0 11/06/2016   CHOLHDL 3 11/06/2016     Current Outpatient Medications on File Prior to Visit  Medication Sig Dispense Refill  . folic acid (FOLVITE) 1 MG tablet TAKE 4 TABLETS BY MOUTH EVERY DAY 120 tablet 4  . lisinopril (PRINIVIL,ZESTRIL) 10 MG tablet TAKE 1 TABLET (10 MG TOTAL) BY MOUTH DAILY. 90 tablet 2  . nystatin-triamcinolone (MYCOLOG II) cream     . sertraline (ZOLOFT) 25 MG tablet Take 1 tablet (25 mg total) by mouth daily. 90 tablet 0   No current facility-administered medications on file prior to visit.     Allergies  Allergen Reactions  . Codeine Nausea And Vomiting    Past Medical History:  Diagnosis Date  . Anxiety 2011   Post Partum Anxiety/Depression treated w/  medication. Now resolved.  . Family history of adverse reaction to anesthesia    Maternal aunt - PONV  . GERD (gastroesophageal reflux disease)    Mild baseline- worse w/pregnancy. Takes TUMS prn.  . History of pre-eclampsia in prior pregnancy, currently pregnant   . Hypertension    On Labetalol  . Motion sickness    cars, boats  . MTHFR mutation ([place]) 03/27/2013  . PONV (postoperative nausea and vomiting)    OK after colonoscopy with propofol  . Postpartum care following cesarean delivery (03/27/13) 03/27/2013  . S/P repeat low transverse C-section 03/30/2013    Past Surgical History:  Procedure Laterality Date  . CESAREAN SECTION  2011   at [facility]  . CESAREAN SECTION N/A 03/27/2013   Procedure: REPEAT CESAREAN SECTION;  Surgeon: Claiborne Billings A. Pamala Hurry, MD;  Location: [place] [facility];  Service: Obstetrics;  Laterality: N/A;  Repeat C/S  EDD: 04/23/13  . CHOLECYSTECTOMY  ~2003   [facility] in [place]  . COLONOSCOPY WITH PROPOFOL N/A 01/02/2017   Procedure: COLONOSCOPY WITH PROPOFOL;  Surgeon: Lucilla Lame, MD;  Location: [facility] ENDOSCOPY;  Service: Endoscopy;  Laterality: N/A;  . DIAGNOSTIC LAPAROSCOPY Left ~2008   Ovarian Cyst removed- [facility]. in [place]  . KNEE ARTHROSCOPY W/ ACL RECONSTRUCTION AND PATELLA GRAFT  Right [place] [facility]. in [place]  . KNEE ARTHROSCOPY W/ PARTIAL MEDIAL MENISCECTOMY Right 2000    Family History  Problem Relation Age of Onset  . Hypertension Mother   . Diabetes Mother   . Arthritis Mother   . Arthritis Father     Social History   Socioeconomic History  . Marital status: Married    Spouse name: Not on file  . Number of children: Not on file  . Years of education: Not on file  . Highest education level: Not on file  Social Needs  . Financial resource strain: Not on file  . Food insecurity - worry: Not on file  . Food insecurity - inability: Not on file  . Transportation needs - medical: Not on file  . Transportation needs - non-medical: Not on file    Occupational History  . Not on file  Tobacco Use  . Smoking status: Never Smoker  . Smokeless tobacco: Never Used  Substance and Sexual Activity  . Alcohol use: No    Alcohol/week: 0.0 oz  . Drug use: No  . Sexual activity: Yes  Other Topics Concern  . Not on file  Social History Narrative   Married.   One son, [name] (age 45).   [redacted] weeks pregnant- 11/2011.   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   The PMH, PSH, Social History, Family History, Medications, and allergies have been reviewed in [facility], and have been updated if relevant.   Review of Systems  Constitutional: Positive for fatigue.  HENT: Negative.   Respiratory: Negative.   Gastrointestinal: Negative for anal bleeding, blood in stool, constipation, diarrhea, nausea, rectal pain and vomiting.  Endocrine: Negative.   Genitourinary: Negative.   Musculoskeletal: Negative.   Neurological: Negative.   Hematological: Negative.   Psychiatric/Behavioral: Negative.   All other systems reviewed and are negative.      Objective:    BP 126/80 (BP Location: Left Arm, Patient Position: Sitting, Cuff Size: Large)   Pulse 63   Temp 98.4 F (36.9 C) (Oral)   Ht 5' 7.5" (1.715 m)   Wt 248 lb 6.4 oz (112.7 kg)   LMP 08/04/2014   SpO2 97%   BMI 38.33 kg/m   Wt Readings from Last 3 Encounters:  11/22/17 248 lb 6.4 oz (112.7 kg)  10/23/17 239 lb 9.6 oz (108.7 kg)  12/19/16 260 lb (117.9 kg)    Physical Exam  Constitutional: She is oriented to person, place, and time. She appears well-developed and well-nourished. No distress.  HENT:  Head: Normocephalic and atraumatic.  Eyes: Conjunctivae are normal.  Neck: Neck supple.  Cardiovascular: Normal rate and regular rhythm.  Pulmonary/Chest: Effort normal and breath sounds normal.  Abdominal: Soft. Bowel sounds are normal. She exhibits no distension and no mass. There is no tenderness. There is no rebound and no guarding.  Musculoskeletal: Normal range of motion. She exhibits no  tenderness.  Neurological: She is alert and oriented to person, place, and time. No cranial nerve deficit.  Skin: Skin is warm and dry. She is not diaphoretic.  Psychiatric: She has a normal mood and affect. Her behavior is normal. Judgment and thought content normal.  Nursing note and vitals reviewed.        Assessment & Plan:   Well woman exam without gynecological exam  Mixed hyperlipidemia - Plan: CBC with Differential/Platelet, Comprehensive metabolic panel, Lipid panel, TSH  Essential hypertension  Need for Tdap vaccination - Plan: Tdap vaccine greater than or equal to  7yo IM  Other fatigue No Follow-up on file.

## 2017-11-23 ENCOUNTER — Telehealth: Payer: Self-pay

## 2017-11-23 NOTE — Telephone Encounter (Signed)
LMOVM that labs look excellent and to keep up the good work per TA and that her form has been faxed/advised to call with any questions/thx dmf

## 2017-12-06 ENCOUNTER — Encounter: Payer: Self-pay | Admitting: Family Medicine

## 2017-12-06 ENCOUNTER — Ambulatory Visit: Payer: 59 | Admitting: Family Medicine

## 2017-12-06 ENCOUNTER — Ambulatory Visit (INDEPENDENT_AMBULATORY_CARE_PROVIDER_SITE_OTHER): Payer: 59

## 2017-12-06 VITALS — BP 128/80 | HR 75 | Temp 98.9°F | Ht 67.5 in | Wt 251.0 lb

## 2017-12-06 DIAGNOSIS — R0982 Postnasal drip: Secondary | ICD-10-CM

## 2017-12-06 DIAGNOSIS — M545 Low back pain, unspecified: Secondary | ICD-10-CM

## 2017-12-06 DIAGNOSIS — R05 Cough: Secondary | ICD-10-CM | POA: Diagnosis not present

## 2017-12-06 DIAGNOSIS — R3915 Urgency of urination: Secondary | ICD-10-CM

## 2017-12-06 DIAGNOSIS — R059 Cough, unspecified: Secondary | ICD-10-CM

## 2017-12-06 LAB — URINALYSIS, ROUTINE W REFLEX MICROSCOPIC
BILIRUBIN URINE: NEGATIVE
Ketones, ur: NEGATIVE
Leukocytes, UA: NEGATIVE
NITRITE: NEGATIVE
Specific Gravity, Urine: 1.01 (ref 1.000–1.030)
Total Protein, Urine: NEGATIVE
Urine Glucose: NEGATIVE
Urobilinogen, UA: 0.2 (ref 0.0–1.0)
pH: 6 (ref 5.0–8.0)

## 2017-12-06 LAB — POCT URINALYSIS DIPSTICK
Bilirubin, UA: NEGATIVE
Glucose, UA: NEGATIVE
KETONES UA: NEGATIVE
LEUKOCYTES UA: NEGATIVE
NITRITE UA: NEGATIVE
PROTEIN UA: NEGATIVE
RBC UA: POSITIVE
SPEC GRAV UA: 1.015 (ref 1.010–1.025)
UROBILINOGEN UA: 0.2 U/dL
pH, UA: 6 (ref 5.0–8.0)

## 2017-12-06 LAB — CBC
HEMATOCRIT: 39.2 % (ref 36.0–46.0)
HEMOGLOBIN: 13.3 g/dL (ref 12.0–15.0)
MCHC: 34.1 g/dL (ref 30.0–36.0)
MCV: 86.5 fl (ref 78.0–100.0)
PLATELETS: 234 10*3/uL (ref 150.0–400.0)
RBC: 4.53 Mil/uL (ref 3.87–5.11)
RDW: 13.6 % (ref 11.5–15.5)
WBC: 4.5 10*3/uL (ref 4.0–10.5)

## 2017-12-06 MED ORDER — FLUTICASONE PROPIONATE 50 MCG/ACT NA SUSP: 2.0000 | Freq: Every day | NASAL | 6 refills | Status: DC

## 2017-12-06 MED ORDER — TRAMADOL HCL 50 MG PO TABS: 50.0000 mg | ORAL_TABLET | Freq: Four times a day (QID) | ORAL | 0 refills | Status: DC | PRN

## 2017-12-06 MED ORDER — KETOROLAC TROMETHAMINE 60 MG/2ML IM SOLN
60.0000 mg | Freq: Once | INTRAMUSCULAR | Status: AC
Start: 2017-12-06 — End: 2017-12-06
  Administered 2017-12-06: 60 mg via INTRAMUSCULAR

## 2017-12-06 MED ORDER — PREDNISONE 20 MG PO TABS: 20.0000 mg | ORAL_TABLET | Freq: Two times a day (BID) | ORAL | 0 refills | Status: AC

## 2017-12-06 MED ORDER — AMOXICILLIN-POT CLAVULANATE 875-125 MG PO TABS: 1.0000 | ORAL_TABLET | Freq: Two times a day (BID) | ORAL | 0 refills | Status: DC

## 2017-12-06 NOTE — Progress Notes (Signed)
Subjective:  Patient ID: Jill Cruz, female    DOB: December 12, 1965  Age: 52 y.o. MRN: [id number]  CC: Back Pain; Nasal Congestion; and Diarrhea   HPI Jill Cruz presents for  evaluation of bilateral lower back pain for the last 4-5 days.  There is been no injury.  There is no radiation of pain.  She is having concomitant bilateral upper thigh pain.  There is no saddle paresthesias or bowel or bladder incontinence..  She is the mother of a 62-year-old likes to be held.  She is also had fevers and chills with generalized myalgias over the last 4 days.  She denies dysuria, frequency but does admit to some urgency.  No unusual vaginal discharge.  Over the last month or 2 she has been having stuffy nose and drainage.  In the last week or 2 she has developed a cough that is productive of dark clear and yellow phlegm.  She is taking 2 rounds of antibiotics for her respiratory tract symptoms.  The first was a Zithromax Z-PAK.  [facility]  These seem to help for a few days and then her symptoms returned.  Outpatient Medications Prior to Visit  Medication Sig Dispense Refill  . folic acid (FOLVITE) 1 MG tablet TAKE 4 TABLETS BY MOUTH EVERY DAY 120 tablet 4  . lisinopril (PRINIVIL,ZESTRIL) 10 MG tablet TAKE 1 TABLET (10 MG TOTAL) BY MOUTH DAILY. 90 tablet 2  . nystatin-triamcinolone (MYCOLOG II) cream     . sertraline (ZOLOFT) 25 MG tablet Take 1 tablet (25 mg total) by mouth daily. 90 tablet 0   No facility-administered medications prior to visit.     ROS Review of Systems  Constitutional: Positive for chills, fatigue and fever. Negative for unexpected weight change.  HENT: Positive for congestion, postnasal drip and rhinorrhea. Negative for dental problem, sinus pressure, sinus pain, sore throat and trouble swallowing.   Eyes: Negative for photophobia and visual disturbance.  Respiratory: Positive for cough. Negative for chest tightness and  wheezing.   Cardiovascular: Negative.   Gastrointestinal: Negative.   Genitourinary: Positive for urgency. Negative for difficulty urinating, dysuria and vaginal discharge.  Musculoskeletal: Positive for back pain and myalgias.  Skin: Negative.  Negative for rash.  Allergic/Immunologic: Negative for immunocompromised state.  Neurological: Negative for weakness, numbness and headaches.  Hematological: Does not bruise/bleed easily.  Psychiatric/Behavioral: Negative.     Objective:  BP 128/80 (BP Location: Left Arm, Patient Position: Sitting, Cuff Size: Normal)   Pulse 75   Temp 98.9 F (37.2 C) (Oral)   Ht 5' 7.5" (1.715 m)   Wt 251 lb (113.9 kg)   LMP 08/04/2014   SpO2 100%   BMI 38.73 kg/m   BP Readings from Last 3 Encounters:  12/06/17 128/80  11/22/17 126/80  10/23/17 110/72    Wt Readings from Last 3 Encounters:  12/06/17 251 lb (113.9 kg)  11/22/17 248 lb 6.4 oz (112.7 kg)  10/23/17 239 lb 9.6 oz (108.7 kg)    Physical Exam  Constitutional: She is oriented to person, place, and time. She appears well-developed and well-nourished. No distress.  HENT:  Head: Normocephalic and atraumatic.  Right Ear: External ear normal.  Left Ear: External ear normal.  Mouth/Throat: Oropharynx is clear and moist. No oropharyngeal exudate.  Eyes: Conjunctivae are normal. Pupils are equal, round, and reactive to light. Right eye exhibits no discharge. Left eye exhibits no discharge. No scleral icterus.  Neck: Neck supple.  No JVD present. No tracheal deviation present. No thyromegaly present.  Cardiovascular: Normal rate, regular rhythm and normal heart sounds.  Pulmonary/Chest: Effort normal and breath sounds normal. No stridor.  Abdominal: Bowel sounds are normal.  Musculoskeletal:       Lumbar back: She exhibits tenderness. She exhibits normal range of motion and no bony tenderness.  Lymphadenopathy:    She has no cervical adenopathy.  Neurological: She is alert and oriented to  person, place, and time. She has normal strength.  Reflex Scores:      Patellar reflexes are 2+ on the right side and 2+ on the left side.      Achilles reflexes are 1+ on the right side and 1+ on the left side. Negative dural tension signs.   Skin: Skin is warm and dry. No rash noted. She is not diaphoretic.  Psychiatric: She has a normal mood and affect.    Lab Results  Component Value Date   WBC 7.4 11/22/2017   HGB 13.4 11/22/2017   HCT 40.2 11/22/2017   PLT 289.0 11/22/2017   GLUCOSE 77 11/22/2017   CHOL 144 11/22/2017   TRIG 104.0 11/22/2017   HDL 48.00 11/22/2017   LDLCALC 75 11/22/2017   ALT 25 11/22/2017   AST 20 11/22/2017   NA 138 11/22/2017   K 4.2 11/22/2017   CL 104 11/22/2017   CREATININE 0.62 11/22/2017   BUN 14 11/22/2017   CO2 27 11/22/2017   TSH 1.00 11/22/2017    No results found.  Assessment & Plan:   Jill Cruz was seen today for back pain, nasal congestion and diarrhea.  Diagnoses and all orders for this visit:  Cough -     CBC -     DG Chest 2 View; Future -     DG Chest 2 View -     amoxicillin-clavulanate (AUGMENTIN) 875-125 MG tablet; Take 1 tablet by mouth 2 (two) times daily. -     predniSONE (DELTASONE) 20 MG tablet; Take 1 tablet (20 mg total) by mouth 2 (two) times daily with a meal for 7 days. -     fluticasone (FLONASE) 50 MCG/ACT nasal spray; Place 2 sprays into both nostrils daily.  Urinary urgency -     CBC -     Urinalysis, Routine w reflex microscopic -     POCT urinalysis dipstick  Acute bilateral low back pain without sciatica -     ketorolac (TORADOL) injection 60 mg -     predniSONE (DELTASONE) 20 MG tablet; Take 1 tablet (20 mg total) by mouth 2 (two) times daily with a meal for 7 days. -     traMADol (ULTRAM) 50 MG tablet; Take 1 tablet (50 mg total) by mouth every 6 (six) hours as needed.  Post-nasal drip -     predniSONE (DELTASONE) 20 MG tablet; Take 1 tablet (20 mg total) by mouth 2 (two) times daily with a meal  for 7 days. -     fluticasone (FLONASE) 50 MCG/ACT nasal spray; Place 2 sprays into both nostrils daily.   I am having Jill Cruz. Jill Cruz" start on amoxicillin-clavulanate, predniSONE, traMADol, and fluticasone. I am also having her maintain her nystatin-triamcinolone, lisinopril, folic acid, and sertraline. We administered ketorolac.  Meds ordered this encounter  Medications  . ketorolac (TORADOL) injection 60 mg  . amoxicillin-clavulanate (AUGMENTIN) 875-125 MG tablet    Sig: Take 1 tablet by mouth 2 (two) times daily.    Dispense:  20 tablet  Refill:  0  . predniSONE (DELTASONE) 20 MG tablet    Sig: Take 1 tablet (20 mg total) by mouth 2 (two) times daily with a meal for 7 days.    Dispense:  14 tablet    Refill:  0  . traMADol (ULTRAM) 50 MG tablet    Sig: Take 1 tablet (50 mg total) by mouth every 6 (six) hours as needed.    Dispense:  30 tablet    Refill:  0  . fluticasone (FLONASE) 50 MCG/ACT nasal spray    Sig: Place 2 sprays into both nostrils daily.    Dispense:  16 g    Refill:  6   Ramping up patient's treatment will Augmentin 875 twice daily for 10 days.  That if her mid and lower back pain in her sinuses as well.  She will use tramadol or acute back pain as needed.  Warned her that that Flonase will take a week or so to work for her and encourage her encouraged her to stick with it.  Follow-up in 1 week if not improving.  Follow-up: Return in about 1 week (around 12/13/2017), or if symptoms worsen or fail to improve.  Libby Maw, MD

## 2017-12-06 NOTE — Patient Instructions (Addendum)
Back Exercises The following exercises strengthen the muscles that help to support the back. They also help to keep the lower back flexible. Doing these exercises can help to prevent back pain or lessen existing pain. If you have back pain or discomfort, try doing these exercises 2-3 times each day or as told by your health care provider. When the pain goes away, do them once each day, but increase the number of times that you repeat the steps for each exercise (do more repetitions). If you do not have back pain or discomfort, do these exercises once each day or as told by your health care provider. Exercises Single Knee to Chest  Repeat these steps 3-5 times for each leg: 1. Lie on your back on a firm bed or the floor with your legs extended. 2. Bring one knee to your chest. Your other leg should stay extended and in contact with the floor. 3. Hold your knee in place by grabbing your knee or thigh. 4. Pull on your knee until you feel a gentle stretch in your lower back. 5. Hold the stretch for 10-30 seconds. 6. Slowly release and straighten your leg.  Pelvic Tilt  Repeat these steps 5-10 times: 1. Lie on your back on a firm bed or the floor with your legs extended. 2. Bend your knees so they are pointing toward the ceiling and your feet are flat on the floor. 3. Tighten your lower abdominal muscles to press your lower back against the floor. This motion will tilt your pelvis so your tailbone points up toward the ceiling instead of pointing to your feet or the floor. 4. With gentle tension and even breathing, hold this position for 5-10 seconds.  Cat-Cow  Repeat these steps until your lower back becomes more flexible: 1. Get into a hands-and-knees position on a firm surface. Keep your hands under your shoulders, and keep your knees under your hips. You may place padding under your knees for comfort. 2. Let your head hang down, and point your tailbone toward the floor so your lower back  becomes rounded like the back of a cat. 3. Hold this position for 5 seconds. 4. Slowly lift your head and point your tailbone up toward the ceiling so your back forms a sagging arch like the back of a cow. 5. Hold this position for 5 seconds.  Press-Ups  Repeat these steps 5-10 times: 1. Lie on your abdomen (face-down) on the floor. 2. Place your palms near your head, about shoulder-width apart. 3. While you keep your back as relaxed as possible and keep your hips on the floor, slowly straighten your arms to raise the top half of your body and lift your shoulders. Do not use your back muscles to raise your upper torso. You may adjust the placement of your hands to make yourself more comfortable. 4. Hold this position for 5 seconds while you keep your back relaxed. 5. Slowly return to lying flat on the floor.  Bridges  Repeat these steps 10 times: 1. Lie on your back on a firm surface. 2. Bend your knees so they are pointing toward the ceiling and your feet are flat on the floor. 3. Tighten your buttocks muscles and lift your buttocks off of the floor until your waist is at almost the same height as your knees. You should feel the muscles working in your buttocks and the back of your thighs. If you do not feel these muscles, slide your feet 1-2 inches farther away  from your buttocks. 4. Hold this position for 3-5 seconds. 5. Slowly lower your hips to the starting position, and allow your buttocks muscles to relax completely.  If this exercise is too easy, try doing it with your arms crossed over your chest. Abdominal Crunches  Repeat these steps 5-10 times: 1. Lie on your back on a firm bed or the floor with your legs extended. 2. Bend your knees so they are pointing toward the ceiling and your feet are flat on the floor. 3. Cross your arms over your chest. 4. Tip your chin slightly toward your chest without bending your neck. 5. Tighten your abdominal muscles and slowly raise your  trunk (torso) high enough to lift your shoulder blades a tiny bit off of the floor. Avoid raising your torso higher than that, because it can put too much stress on your low back and it does not help to strengthen your abdominal muscles. 6. Slowly return to your starting position.  Back Lifts Repeat these steps 5-10 times: 1. Lie on your abdomen (face-down) with your arms at your sides, and rest your forehead on the floor. 2. Tighten the muscles in your legs and your buttocks. 3. Slowly lift your chest off of the floor while you keep your hips pressed to the floor. Keep the back of your head in line with the curve in your back. Your eyes should be looking at the floor. 4. Hold this position for 3-5 seconds. 5. Slowly return to your starting position.  Contact a health care provider if:  Your back pain or discomfort gets much worse when you do an exercise.  Your back pain or discomfort does not lessen within 2 hours after you exercise. If you have any of these problems, stop doing these exercises right away. Do not do them again unless your health care provider says that you can. Get help right away if:  You develop sudden, severe back pain. If this happens, stop doing the exercises right away. Do not do them again unless your health care provider says that you can. This information is not intended to replace advice given to you by your health care provider. Make sure you discuss any questions you have with your health care provider. Document Released: 10/26/2004 Document Revised: 01/26/2016 Document Reviewed: 11/12/2014 Elsevier Interactive Patient Education  2017 [place] Injury Prevention Back injuries can be very painful. They can also be difficult to heal. After having one back injury, you are more likely to injure your back again. It is important to learn how to avoid injuring or re-injuring your back. The following tips can help you to prevent a back injury. What should I  know about physical fitness?  Exercise for 30 minutes per day on most days of the week or as directed by your health care provider. Make sure to: ? Do aerobic exercises, such as walking, jogging, biking, or swimming. ? Do exercises that increase balance and strength, such as tai chi and yoga. These can decrease your risk of falling and injuring your back. ? Do stretching exercises to help with flexibility. ? Try to develop strong abdominal muscles. Your abdominal muscles provide a lot of the support that is needed by your back.  Maintain a healthy weight. This helps to decrease your risk of a back injury. What should I know about my diet?  Talk with your health care provider about your overall diet. Take supplements and vitamins only as directed by your health care provider.  Talk with your health care provider about how much calcium and vitamin D you need each day. These nutrients help to prevent weakening of the bones (osteoporosis). Osteoporosis can cause broken (fractured) bones, which lead to back pain.  Include good sources of calcium in your diet, such as dairy products, green leafy vegetables, and products that have had calcium added to them (fortified).  Include good sources of vitamin D in your diet, such as milk and foods that are fortified with vitamin D. What should I know about my posture?  Sit up straight and stand up straight. Avoid leaning forward when you sit or hunching over when you stand.  Choose chairs that have good low-back (lumbar) support.  If you work at a desk, sit close to it so you do not need to lean over. Keep your chin tucked in. Keep your neck drawn back, and keep your elbows bent at a right angle. Your arms should look like the letter "L."  Sit high and close to the steering wheel when you drive. Add a lumbar support to your car seat, if needed.  Avoid sitting or standing in one position for very long. Take breaks to get up, stretch, and walk around at  least one time every hour. Take breaks every hour if you are driving for long periods of time.  Sleep on your side with your knees slightly bent, or sleep on your back with a pillow under your knees. Do not lie on the front of your body to sleep. What should I know about lifting, twisting, and reaching? Lifting and Heavy Lifting   Avoid heavy lifting, especially repetitive heavy lifting. If you must do heavy lifting: ? Stretch before lifting. ? Work slowly. ? Rest between lifts. ? Use a tool such as a cart or a dolly to move objects if one is available. ? Make several small trips instead of carrying one heavy load. ? Ask for help when you need it, especially when moving big objects.  Follow these steps when lifting: ? Stand with your feet shoulder-width apart. ? Get as close to the object as you can. Do not try to pick up a heavy object that is far from your body. ? Use handles or lifting straps if they are available. ? Bend at your knees. Squat down, but keep your heels off the floor. ? Keep your shoulders pulled back, your chin tucked in, and your back straight. ? Lift the object slowly while you tighten the muscles in your legs, abdomen, and buttocks. Keep the object as close to the center of your body as possible.  Follow these steps when putting down a heavy load: ? Stand with your feet shoulder-width apart. ? Lower the object slowly while you tighten the muscles in your legs, abdomen, and buttocks. Keep the object as close to the center of your body as possible. ? Keep your shoulders pulled back, your chin tucked in, and your back straight. ? Bend at your knees. Squat down, but keep your heels off the floor. ? Use handles or lifting straps if they are available. Twisting and Reaching  Avoid lifting heavy objects above your waist.  Do not twist at your waist while you are lifting or carrying a load. If you need to turn, move your feet.  Do not bend over without bending at your  knees.  Avoid reaching over your head, across a table, or for an object on a high surface. What are some other tips?  Avoid  wet floors and icy ground. Keep sidewalks clear of ice to prevent falls.  Do not sleep on a mattress that is too soft or too hard.  Keep items that are used frequently within easy reach.  Put heavier objects on shelves at waist level, and put lighter objects on lower or higher shelves.  Find ways to decrease your stress, such as exercise, massage, or relaxation techniques. Stress can build up in your muscles. Tense muscles are more vulnerable to injury.  Talk with your health care provider if you feel anxious or depressed. These conditions can make back pain worse.  Wear flat heel shoes with cushioned soles.  Avoid sudden movements.  Use both shoulder straps when carrying a backpack.  Do not use any tobacco products, including cigarettes, chewing tobacco, or electronic cigarettes. If you need help quitting, ask your health care provider. This information is not intended to replace advice given to you by your health care provider. Make sure you discuss any questions you have with your health care provider. Document Released: 10/26/2004 Document Revised: 02/24/2016 Document Reviewed: 09/22/2014 Elsevier Interactive Patient Education  [employer].

## 2017-12-07 ENCOUNTER — Other Ambulatory Visit: Payer: Self-pay | Admitting: Family Medicine

## 2018-01-13 ENCOUNTER — Other Ambulatory Visit: Payer: Self-pay | Admitting: Family Medicine

## 2018-02-04 ENCOUNTER — Other Ambulatory Visit: Payer: Self-pay | Admitting: Family Medicine

## 2018-03-13 ENCOUNTER — Other Ambulatory Visit: Payer: Self-pay

## 2018-03-13 DIAGNOSIS — R0982 Postnasal drip: Secondary | ICD-10-CM

## 2018-03-13 DIAGNOSIS — R059 Cough, unspecified: Secondary | ICD-10-CM

## 2018-03-13 DIAGNOSIS — R05 Cough: Secondary | ICD-10-CM

## 2018-03-13 MED ORDER — FOLIC ACID 1 MG PO TABS: 4.0000 mg | ORAL_TABLET | Freq: Every day | ORAL | 1 refills | Status: DC

## 2018-03-13 MED ORDER — FLUTICASONE PROPIONATE 50 MCG/ACT NA SUSP: 2.0000 | Freq: Every day | NASAL | 0 refills | Status: DC

## 2018-03-13 MED ORDER — LISINOPRIL 10 MG PO TABS: ORAL_TABLET | ORAL | 1 refills | Status: DC

## 2018-03-13 MED ORDER — SERTRALINE HCL 25 MG PO TABS: 25.0000 mg | ORAL_TABLET | Freq: Every day | ORAL | 1 refills | Status: DC

## 2018-03-18 ENCOUNTER — Ambulatory Visit: Payer: 59 | Admitting: Family Medicine

## 2018-06-10 DIAGNOSIS — Z01419 Encounter for gynecological examination (general) (routine) without abnormal findings: Secondary | ICD-10-CM | POA: Diagnosis not present

## 2018-06-10 DIAGNOSIS — Z1231 Encounter for screening mammogram for malignant neoplasm of breast: Secondary | ICD-10-CM | POA: Diagnosis not present

## 2018-06-10 DIAGNOSIS — Z6841 Body Mass Index (BMI) 40.0 and over, adult: Secondary | ICD-10-CM | POA: Diagnosis not present

## 2018-06-25 ENCOUNTER — Ambulatory Visit: Payer: 59 | Admitting: Nurse Practitioner

## 2018-06-25 ENCOUNTER — Encounter: Payer: Self-pay | Admitting: Nurse Practitioner

## 2018-06-25 VITALS — BP 124/84 | HR 67 | Temp 98.3°F | Ht 67.5 in | Wt 269.0 lb

## 2018-06-25 DIAGNOSIS — J209 Acute bronchitis, unspecified: Secondary | ICD-10-CM | POA: Diagnosis not present

## 2018-06-25 DIAGNOSIS — H6123 Impacted cerumen, bilateral: Secondary | ICD-10-CM

## 2018-06-25 DIAGNOSIS — J0100 Acute maxillary sinusitis, unspecified: Secondary | ICD-10-CM | POA: Diagnosis not present

## 2018-06-25 MED ORDER — AZITHROMYCIN 250 MG PO TABS: 250.0000 mg | ORAL_TABLET | Freq: Every day | ORAL | 0 refills | Status: DC

## 2018-06-25 MED ORDER — BENZONATATE 100 MG PO CAPS: 100.0000 mg | ORAL_CAPSULE | Freq: Three times a day (TID) | ORAL | 0 refills | Status: DC | PRN

## 2018-06-25 MED ORDER — ALBUTEROL SULFATE HFA 108 (90 BASE) MCG/ACT IN AERS: 1.0000 | INHALATION_SPRAY | Freq: Four times a day (QID) | RESPIRATORY_TRACT | 0 refills | Status: DC | PRN

## 2018-06-25 MED ORDER — DM-GUAIFENESIN ER 30-600 MG PO TB12: 1.0000 | ORAL_TABLET | Freq: Two times a day (BID) | ORAL | 0 refills | Status: DC | PRN

## 2018-06-25 MED ORDER — ALBUTEROL SULFATE (2.5 MG/3ML) 0.083% IN NEBU
2.5000 mg | INHALATION_SOLUTION | Freq: Once | RESPIRATORY_TRACT | Status: AC
  Administered 2018-06-25: 2.5 mg via RESPIRATORY_TRACT

## 2018-06-25 MED ORDER — PREDNISONE 10 MG (21) PO TBPK
ORAL_TABLET | ORAL | 0 refills | Status: DC
Start: 2018-06-25 — End: 2018-07-24

## 2018-06-25 MED ORDER — LORATADINE 10 MG PO TABS: 10.0000 mg | ORAL_TABLET | Freq: Every day | ORAL | 11 refills | Status: DC

## 2018-06-25 NOTE — Progress Notes (Signed)
Subjective:  Patient ID: Jill Cruz, female    DOB: [birth year]  Age: 53 y.o. MRN: [id number]  CC: Cough (coughing yellow mucus,chest congestion/ 3 wks/ hx of penumonia last year/took flonase. )  Cough  This is a recurrent problem. The current episode started 1 to 4 weeks ago. The problem has been waxing and waning. The cough is productive of purulent sputum. Associated symptoms include chills, headaches, nasal congestion, postnasal drip, rhinorrhea and a sore throat. Pertinent negatives include no ear congestion, ear pain, heartburn, shortness of breath or wheezing. The symptoms are aggravated by lying down. She has tried OTC cough suppressant for the symptoms. The treatment provided no relief. There is no history of asthma, bronchitis or environmental allergies.  no GERD.  Reviewed past Medical, Social and Family history today.  Outpatient Medications Prior to Visit  Medication Sig Dispense Refill  . fluticasone (FLONASE) 50 MCG/ACT nasal spray Place 2 sprays into both nostrils daily. 48 g 0  . folic acid (FOLVITE) 1 MG tablet Take 4 tablets (4 mg total) by mouth daily. 360 tablet 1  . lisinopril (PRINIVIL,ZESTRIL) 10 MG tablet TAKE 1 TABLET (10 MG TOTAL) BY MOUTH DAILY. 90 tablet 1  . nystatin-triamcinolone (MYCOLOG II) cream     . sertraline (ZOLOFT) 25 MG tablet Take 1 tablet (25 mg total) by mouth daily. 90 tablet 1  . amoxicillin-clavulanate (AUGMENTIN) 875-125 MG tablet Take 1 tablet by mouth 2 (two) times daily. (Patient not taking: Reported on 06/25/2018) 20 tablet 0  . traMADol (ULTRAM) 50 MG tablet Take 1 tablet (50 mg total) by mouth every 6 (six) hours as needed. (Patient not taking: Reported on 06/25/2018) 30 tablet 0   No facility-administered medications prior to visit.     ROS See HPI  Objective:  BP 124/84   Pulse 67   Temp 98.3 F (36.8 C) (Oral)   Ht 5' 7.5" (1.715 m)   Wt 269 lb (122 kg)   LMP 08/04/2014   SpO2 98%   BMI 41.51 kg/m   BP Readings  from Last 3 Encounters:  06/25/18 124/84  12/06/17 128/80  11/22/17 126/80    Wt Readings from Last 3 Encounters:  06/25/18 269 lb (122 kg)  12/06/17 251 lb (113.9 kg)  11/22/17 248 lb 6.4 oz (112.7 kg)    Physical Exam  Constitutional: She is oriented to person, place, and time.  HENT:  Right Ear: External ear and ear canal normal. Tympanic membrane is not injected and not erythematous. A middle ear effusion is present.  Left Ear: External ear and ear canal normal. Tympanic membrane is not injected and not erythematous. A middle ear effusion is present.  Nose: Mucosal edema and rhinorrhea present. Right sinus exhibits maxillary sinus tenderness. Left sinus exhibits maxillary sinus tenderness.  Mouth/Throat: Uvula is midline. Posterior oropharyngeal erythema present.  Neck: Normal range of motion. Neck supple.  Cardiovascular: Normal rate and regular rhythm.  Pulmonary/Chest: Effort normal. No respiratory distress. She has wheezes. She has no rales.  Clear lung sounds after nebulizer treatment.  Musculoskeletal: She exhibits no edema.  Lymphadenopathy:    She has no cervical adenopathy.  Neurological: She is alert and oriented to person, place, and time.  Vitals reviewed.  Lab Results  Component Value Date   WBC 4.5 12/06/2017   HGB 13.3 12/06/2017   HCT 39.2 12/06/2017   PLT 234.0 12/06/2017   GLUCOSE 77 11/22/2017   CHOL 144 11/22/2017   TRIG 104.0 11/22/2017   HDL 48.00 11/22/2017  [place] 75 11/22/2017   ALT 25 11/22/2017   AST 20 11/22/2017   NA 138 11/22/2017   K 4.2 11/22/2017   CL 104 11/22/2017   CREATININE 0.62 11/22/2017   BUN 14 11/22/2017   CO2 27 11/22/2017   TSH 1.00 11/22/2017   Procedure Note :     Procedure :  Ear irrigation (bilateral)   Indication:  Cerumen impaction (bilateral)   Risks, including pain, dizziness, eardrum perforation, bleeding, infection and others as well as benefits were explained to the patient in detail. Verbal consent  was obtained and the patient agreed to proceed.    We used "The Elephant Ear Irrigation Device" filled with lukewarm water for irrigation. A large amount wax was recovered. Procedure has also required manual wax removal with an ear loop.   Tolerated well. Complications: None.   Postprocedure instructions :  Call if problems.  Assessment & Plan:   [place] was seen today for cough.  Diagnoses and all orders for this visit:  Acute bronchitis, unspecified organism -     albuterol (PROVENTIL HFA;VENTOLIN HFA) 108 (90 Base) MCG/ACT inhaler; Inhale 1-2 puffs into the lungs every 6 (six) hours as needed. -     dextromethorphan-guaiFENesin (MUCINEX DM) 30-600 MG 12hr tablet; Take 1 tablet by mouth 2 (two) times daily as needed for cough. -     benzonatate (TESSALON) 100 MG capsule; Take 1 capsule (100 mg total) by mouth 3 (three) times daily as needed for cough. -     predniSONE (STERAPRED UNI-PAK 21 TAB) 10 MG (21) TBPK tablet; Take by mouth as directed. -     azithromycin (ZITHROMAX Z-PAK) 250 MG tablet; Take 1 tablet (250 mg total) by mouth daily. Take 2tabs on first day, then 1tab once a day till complete -     albuterol (PROVENTIL) (2.5 MG/3ML) 0.083% nebulizer solution 2.5 mg  Bilateral impacted cerumen  Subacute maxillary sinusitis -     predniSONE (STERAPRED UNI-PAK 21 TAB) 10 MG (21) TBPK tablet; Take by mouth as directed. -     azithromycin (ZITHROMAX Z-PAK) 250 MG tablet; Take 1 tablet (250 mg total) by mouth daily. Take 2tabs on first day, then 1tab once a day till complete -     loratadine (CLARITIN) 10 MG tablet; Take 1 tablet (10 mg total) by mouth daily.   I have discontinued Kirandeep Fariss. Matty "Chris"'s amoxicillin-clavulanate and traMADol. I am also having her start on albuterol, dextromethorphan-guaiFENesin, benzonatate, predniSONE, azithromycin, and loratadine. Additionally, I am having her maintain her nystatin-triamcinolone, fluticasone, folic acid, lisinopril, and  sertraline. We administered albuterol.  Meds ordered this encounter  Medications  . albuterol (PROVENTIL HFA;VENTOLIN HFA) 108 (90 Base) MCG/ACT inhaler    Sig: Inhale 1-2 puffs into the lungs every 6 (six) hours as needed.    Dispense:  1 Inhaler    Refill:  0    Order Specific Question:   Supervising Provider    Answer:   Lucille Passy [[id number]]  . dextromethorphan-guaiFENesin (MUCINEX DM) 30-600 MG 12hr tablet    Sig: Take 1 tablet by mouth 2 (two) times daily as needed for cough.    Dispense:  14 tablet    Refill:  0    Order Specific Question:   Supervising Provider    Answer:   Lucille Passy [[id number]]  . benzonatate (TESSALON) 100 MG capsule    Sig: Take 1 capsule (100 mg total) by mouth 3 (three) times daily as needed for cough.    Dispense:  20 capsule    Refill:  0    Order Specific Question:   Supervising Provider    Answer:   Lucille Passy [[id number]]  . predniSONE (STERAPRED UNI-PAK 21 TAB) 10 MG (21) TBPK tablet    Sig: Take by mouth as directed.    Dispense:  21 tablet    Refill:  0    Order Specific Question:   Supervising Provider    Answer:   Lucille Passy [[id number]]  . azithromycin (ZITHROMAX Z-PAK) 250 MG tablet    Sig: Take 1 tablet (250 mg total) by mouth daily. Take 2tabs on first day, then 1tab once a day till complete    Dispense:  6 tablet    Refill:  0    Order Specific Question:   Supervising Provider    Answer:   Lucille Passy [[id number]]  . loratadine (CLARITIN) 10 MG tablet    Sig: Take 1 tablet (10 mg total) by mouth daily.    Dispense:  30 tablet    Refill:  11    Order Specific Question:   Supervising Provider    Answer:   Lucille Passy [[id number]]  . albuterol (PROVENTIL) (2.5 MG/3ML) 0.083% nebulizer solution 2.5 mg    Follow-up: Return if symptoms worsen or fail to improve.  Wilfred Lacy, NP

## 2018-06-25 NOTE — Patient Instructions (Addendum)
Hold Flonase while taking oral prednisone. Call office if no improvement in 2weeks Maintain adequate oral hydration.  Acute Bronchitis, Adult Acute bronchitis is when air tubes (bronchi) in the lungs suddenly get swollen. The condition can make it hard to breathe. It can also cause these symptoms:  A cough.  Coughing up clear, yellow, or green mucus.  Wheezing.  Chest congestion.  Shortness of breath.  A fever.  Body aches.  Chills.  A sore throat.  Follow these instructions at home: Medicines  Take over-the-counter and prescription medicines only as told by your doctor.  If you were prescribed an antibiotic medicine, take it as told by your doctor. Do not stop taking the antibiotic even if you start to feel better. General instructions  Rest.  Drink enough fluids to keep your pee (urine) clear or pale yellow.  Avoid smoking and secondhand smoke. If you smoke and you need help quitting, ask your doctor. Quitting will help your lungs heal faster.  Use an inhaler, cool mist vaporizer, or humidifier as told by your doctor.  Keep all follow-up visits as told by your doctor. This is important. How is this prevented? To lower your risk of getting this condition again:  Wash your hands often with soap and water. If you cannot use soap and water, use hand sanitizer.  Avoid contact with people who have cold symptoms.  Try not to touch your hands to your mouth, nose, or eyes.  Make sure to get the flu shot every year.  Contact a doctor if:  Your symptoms do not get better in 2 weeks. Get help right away if:  You cough up blood.  You have chest pain.  You have very bad shortness of breath.  You become dehydrated.  You faint (pass out) or keep feeling like you are going to pass out.  You keep throwing up (vomiting).  You have a very bad headache.  Your fever or chills gets worse. This information is not intended to replace advice given to you by your  health care provider. Make sure you discuss any questions you have with your health care provider. Document Released: 03/06/2008 Document Revised: 04/26/2016 Document Reviewed: 03/08/2016 Elsevier Interactive Patient Education  [employer].

## 2018-07-03 DIAGNOSIS — Z23 Encounter for immunization: Secondary | ICD-10-CM | POA: Diagnosis not present

## 2018-07-16 ENCOUNTER — Other Ambulatory Visit: Payer: Self-pay | Admitting: Family Medicine

## 2018-07-16 DIAGNOSIS — R05 Cough: Secondary | ICD-10-CM

## 2018-07-16 DIAGNOSIS — R0982 Postnasal drip: Secondary | ICD-10-CM

## 2018-07-16 DIAGNOSIS — R059 Cough, unspecified: Secondary | ICD-10-CM

## 2018-07-19 DIAGNOSIS — H60339 Swimmer's ear, unspecified ear: Secondary | ICD-10-CM | POA: Diagnosis not present

## 2018-07-24 ENCOUNTER — Ambulatory Visit: Payer: 59 | Admitting: Family Medicine

## 2018-07-24 ENCOUNTER — Encounter: Payer: Self-pay | Admitting: Family Medicine

## 2018-07-24 VITALS — BP 124/82 | HR 86 | Temp 98.3°F | Ht 67.5 in | Wt 274.6 lb

## 2018-07-24 DIAGNOSIS — H6690 Otitis media, unspecified, unspecified ear: Secondary | ICD-10-CM

## 2018-07-24 DIAGNOSIS — Z6841 Body Mass Index (BMI) 40.0 and over, adult: Secondary | ICD-10-CM | POA: Diagnosis not present

## 2018-07-24 DIAGNOSIS — H6090 Unspecified otitis externa, unspecified ear: Secondary | ICD-10-CM

## 2018-07-24 DIAGNOSIS — H60502 Unspecified acute noninfective otitis externa, left ear: Secondary | ICD-10-CM

## 2018-07-24 DIAGNOSIS — Z713 Dietary counseling and surveillance: Secondary | ICD-10-CM | POA: Diagnosis not present

## 2018-07-24 MED ORDER — CIPROFLOXACIN HCL 500 MG PO TABS: 500.0000 mg | ORAL_TABLET | Freq: Two times a day (BID) | ORAL | 0 refills | Status: DC

## 2018-07-24 NOTE — Progress Notes (Signed)
Subjective:   Patient ID: Jill Cruz, female    DOB: Dec 14, 1965, 52 y.o.   MRN: [id number]  Jill Cruz is a pleasant 52 y.o. year old female who presents to clinic today with Ear Problem (Patient is here today C/O left ear pain.  States that there is crusty outside of the ear.  She went to an UC on Friday afternoon and they gave ear drops for an infection in her ear canal (Neo/Poly/Hydrocortisone) which she has been using tid.  The part on the outer ear feels better but now there is pain deep inside and moving behind left ear.  Even feels the pain in the left side of her face.  Sx started on 10.17.19. )  on 07/24/2018  HPI:  Left ear pain-  Went to UC 5 days ago with right ear pain that was crusty on the outer part of her ear.  Per pt, diagnosed with otitis externa and placed on ear drops- neo/polytrim/hydrocoritisone which she has been using three times daily.  The outer part of her ear feels a little better but now there is pain deep inside of her ear and moving behind her left ear and face.  Symptoms originally started on 07/18/18.  No fevers or chills.  She is not a swimmer and has never had symptoms like this before. Current Outpatient Medications on File Prior to Visit  Medication Sig Dispense Refill  . albuterol (PROVENTIL HFA;VENTOLIN HFA) 108 (90 Base) MCG/ACT inhaler Inhale 1-2 puffs into the lungs every 6 (six) hours as needed. 1 Inhaler 0  . fluticasone (FLONASE) 50 MCG/ACT nasal spray Place 2 sprays into both nostrils daily. 48 g 0  . folic acid (FOLVITE) 1 MG tablet Take 4 tablets (4 mg total) by mouth daily. 360 tablet 1  . lisinopril (PRINIVIL,ZESTRIL) 10 MG tablet TAKE 1 TABLET (10 MG TOTAL) BY MOUTH DAILY. 90 tablet 1  . loratadine (CLARITIN) 10 MG tablet Take 1 tablet (10 mg total) by mouth daily. 30 tablet 11  . nystatin-triamcinolone (MYCOLOG II) cream     . sertraline (ZOLOFT) 25 MG tablet Take 1 tablet (25 mg total) by mouth daily. 90 tablet 1    No current facility-administered medications on file prior to visit.     Allergies  Allergen Reactions  . Codeine Nausea And Vomiting    Past Medical History:  Diagnosis Date  . Anxiety 2011   Post Partum Anxiety/Depression treated w/ medication. Now resolved.  . Family history of adverse reaction to anesthesia    Maternal aunt - PONV  . GERD (gastroesophageal reflux disease)    Mild baseline- worse w/pregnancy. Takes TUMS prn.  . History of pre-eclampsia in prior pregnancy, currently pregnant   . Hypertension    On Labetalol  . Motion sickness    cars, boats  . MTHFR mutation ([facility]) 03/27/2013  . PONV (postoperative nausea and vomiting)    OK after colonoscopy with propofol  . Postpartum care following cesarean delivery (03/27/13) 03/27/2013  . S/P repeat low transverse C-section 03/30/2013    Past Surgical History:  Procedure Laterality Date  . CESAREAN SECTION  2011   at [facility]  . CESAREAN SECTION N/A 03/27/2013   Procedure: REPEAT CESAREAN SECTION;  Surgeon: Claiborne Billings A. Pamala Hurry, MD;  Location: [facility];  Service: Obstetrics;  Laterality: N/A;  Repeat C/S  EDD: 04/23/13  . CHOLECYSTECTOMY  ~2003   [facility] in [place]  . COLONOSCOPY WITH PROPOFOL N/A 01/02/2017   Procedure: COLONOSCOPY WITH PROPOFOL;  Surgeon: Lucilla Lame, MD;  Location: [facility] END[facility]COPY;  Service: Endoscopy;  Laterality: N/A;  . DIAGNOSTIC LAPAROSCOPY Left ~2008   Ovarian Cyst removed- [facility]. in [place]  . KNEE ARTHROSCOPY W/ ACL RECONSTRUCTION AND PATELLA GRAFT Right 2000   [facility]. in [place]  . KNEE ARTHROSCOPY W/ PARTIAL MEDIAL MENISCECTOMY Right 2000    Family History  Problem Relation Age of Onset  . Hypertension Mother   . Diabetes Mother   . Arthritis Mother   . Arthritis Father     Social History   Socioeconomic History  . Marital status: Married    Spouse name: Not on file  . Number of children: Not on file  . Years of education: Not on file  . Highest education level: Not on file   Occupational History  . Not on file  Social Needs  . Financial resource strain: Not on file  . Food insecurity:    Worry: Not on file    Inability: Not on file  . Transportation needs:    Medical: Not on file    Non-medical: Not on file  Tobacco Use  . Smoking status: Never Smoker  . Smokeless tobacco: Never Used  Substance and Sexual Activity  . Alcohol use: No    Alcohol/week: 0.0 standard drinks  . Drug use: No  . Sexual activity: Yes  Lifestyle  . Physical activity:    Days per week: Not on file    Minutes per session: Not on file  . Stress: Not on file  Relationships  . Social connections:    Talks on phone: Not on file    Gets together: Not on file    Attends religious service: Not on file    Active member of club or organization: Not on file    Attends meetings of clubs or organizations: Not on file    Relationship status: Not on file  . Intimate partner violence:    Fear of current or ex partner: Not on file    Emotionally abused: Not on file    Physically abused: Not on file    Forced sexual activity: Not on file  Other Topics Concern  . Not on file  Social History Narrative   Married.   One son, Jill Cruz (age 46).   [redacted] weeks pregnant- 11/2011.   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   The PMH, PSH, Social History, Family History, Medications, and allergies have been reviewed in [facility], and have been updated if relevant.   Review of Systems  Constitutional: Negative.   HENT: Positive for ear discharge, ear pain and facial swelling. Negative for congestion, dental problem, drooling, hearing loss, mouth sores, nosebleeds, postnasal drip, rhinorrhea, sinus pressure, sinus pain, sneezing, sore throat, tinnitus, trouble swallowing and voice change.   Respiratory: Negative.   Cardiovascular: Negative.   Skin: Negative.   Neurological: Negative.   Psychiatric/Behavioral: Negative.   All other systems reviewed and are negative.      Objective:    BP 124/82 (BP  Location: Left Arm, Patient Position: Sitting, Cuff Size: Normal)   Pulse 86   Temp 98.3 F (36.8 C) (Oral)   Ht 5' 7.5" (1.715 m)   Wt 274 lb 9.6 oz (124.6 kg)   LMP 08/04/2014   SpO2 97%   BMI 42.37 kg/m    Physical Exam  Constitutional: She is oriented to person, place, and time. She appears well-developed and well-nourished. No distress.  HENT:  Head: Normocephalic and atraumatic.  Right Ear: Hearing,  tympanic membrane, external ear and ear canal normal.   Left ear reveals tenderness of the tragus but per patient, much less tenderness with manipulation of her outer ear than she had prior to starting the ear drops; debris and inflammation in external canal. TM is not well seen due to debris  Eyes: EOM are normal.  Neck: Normal range of motion.  Cardiovascular: Normal rate and regular rhythm.  Pulmonary/Chest: Effort normal and breath sounds normal.  Musculoskeletal: Normal range of motion.  Neurological: She is alert and oriented to person, place, and time. No cranial nerve deficit.  Skin: Skin is warm and dry. She is not diaphoretic.  Psychiatric: She has a normal mood and affect. Her behavior is normal. Judgment and thought content normal.  Nursing note and vitals reviewed.         Assessment & Plan:   Acute otitis externa of left ear, unspecified type  Otitis media, unspecified laterality, unspecified otitis media type No follow-ups on file.

## 2018-07-24 NOTE — Assessment & Plan Note (Signed)
Complicated course- I did not see her for the initial diagnosis but she still has quite a bit of debris and inflammation in her left ear canal.  I cannot visualize the left TM and now she is having pain move deeper into her ear and near her sinuses. ?developing otitis media as well vs a protracted severe otitis externa. Continue current drops with polytrim/hydrocortisone and add systemic antibiotic which will cover pseudomonas- eRx sent for cipro 500 mg twice daily 10 days. Call or return to clinic prn if these symptoms worsen or fail to improve as anticipated.  Will refer to ENT if no improvement within 2 days. The patient indicates understanding of these issues and agrees with the plan.

## 2018-07-24 NOTE — Patient Instructions (Signed)
Great to see you.  Take ciprofloxacin as directed- 1 tablet twice daily for 10 days. Please keep me updated.   Otitis Externa Otitis externa is an infection of the outer ear canal. The outer ear canal is the area between the outside of the ear and the eardrum. Otitis externa is sometimes called "swimmer's ear." What are the causes? This condition may be caused by:  Swimming in dirty water.  Moisture in the ear.  An injury to the inside of the ear.  An object stuck in the ear.  A cut or scrape on the outside of the ear.  What increases the risk? This condition is more likely to develop in swimmers. What are the signs or symptoms? The first symptom of this condition is often itching in the ear. Later signs and symptoms include:  Swelling of the ear.  Redness in the ear.  Ear pain. The pain may get worse when you pull on your ear.  Pus coming from the ear.  How is this diagnosed? This condition may be diagnosed by examining the ear and testing fluid from the ear for bacteria and funguses. How is this treated? This condition may be treated with:  Antibiotic ear drops. These are often given for 10-14 days.  Medicine to reduce itching and swelling.  Follow these instructions at home:  If you were prescribed antibiotic ear drops, apply them as told by your health care provider. Do not stop using the antibiotic even if your condition improves.  Take over-the-counter and prescription medicines only as told by your health care provider.  Keep all follow-up visits as told by your health care provider. This is important. How is this prevented?  Keep your ear dry. Use the corner of a towel to dry your ear after you swim or bathe.  Avoid scratching or putting things in your ear. Doing these things can damage the ear canal or remove the protective wax that lines it, which makes it easier for bacteria and funguses to grow.  Avoid swimming in lakes, polluted water, or pools  that may not have the right amount of chlorine.  Consider making ear drops and putting 3 or 4 drops in each ear after you swim. Ask your health care provider about how you can make ear drops. Contact a health care provider if:  You have a fever.  After 3 days your ear is still red, swollen, painful, or draining pus.  Your redness, swelling, or pain gets worse.  You have a severe headache.  You have redness, swelling, pain, or tenderness in the area behind your ear. This information is not intended to replace advice given to you by your health care provider. Make sure you discuss any questions you have with your health care provider. Document Released: 09/18/2005 Document Revised: 10/26/2015 Document Reviewed: 06/28/2015 Elsevier Interactive Patient Education  [employer].

## 2018-08-13 ENCOUNTER — Other Ambulatory Visit: Payer: Self-pay | Admitting: Family Medicine

## 2018-08-16 IMAGING — DX DG CHEST 2V
2 series · 2 of 2 positions shown · non-contrast
Comparison: None.

CLINICAL DATA: Cough

EXAM:
CHEST - 2 VIEW

[chest pa]
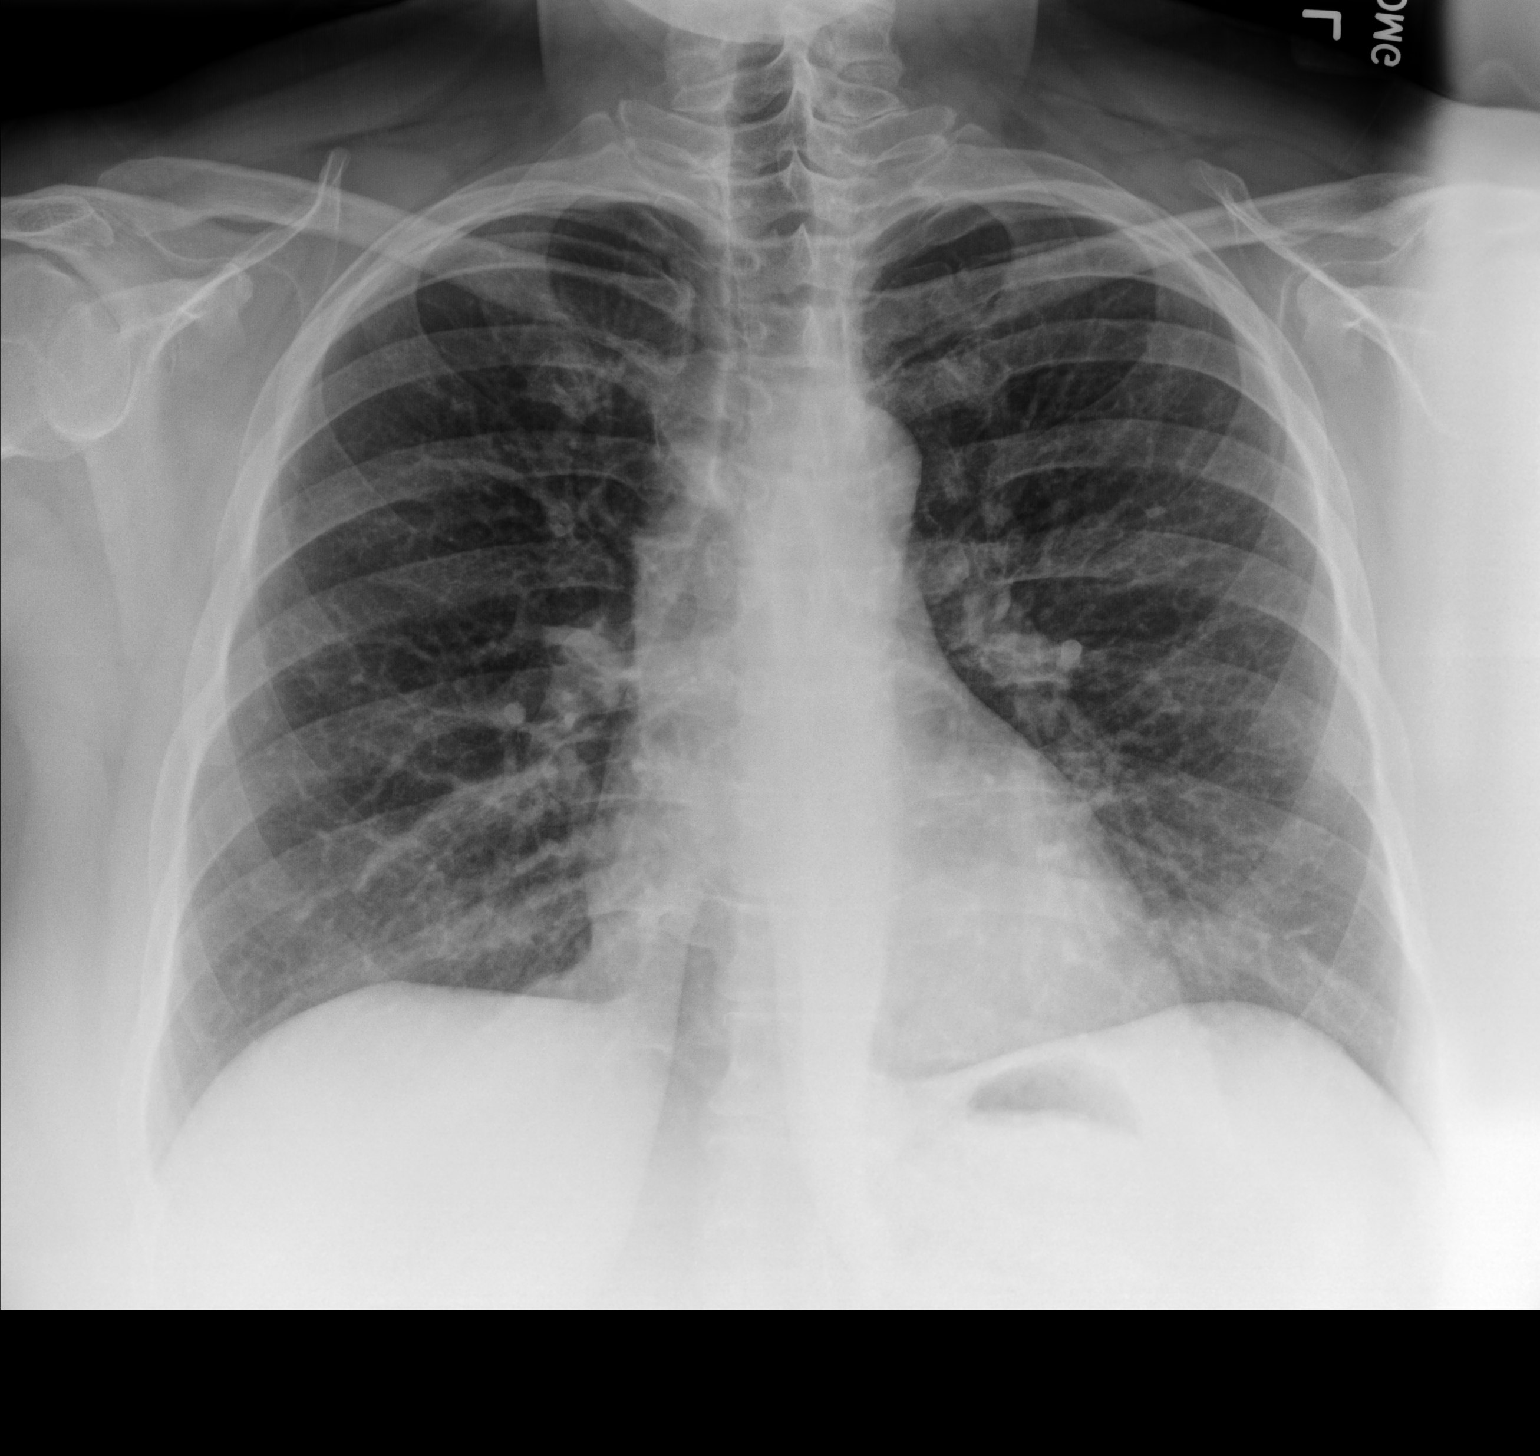

[chest lat]
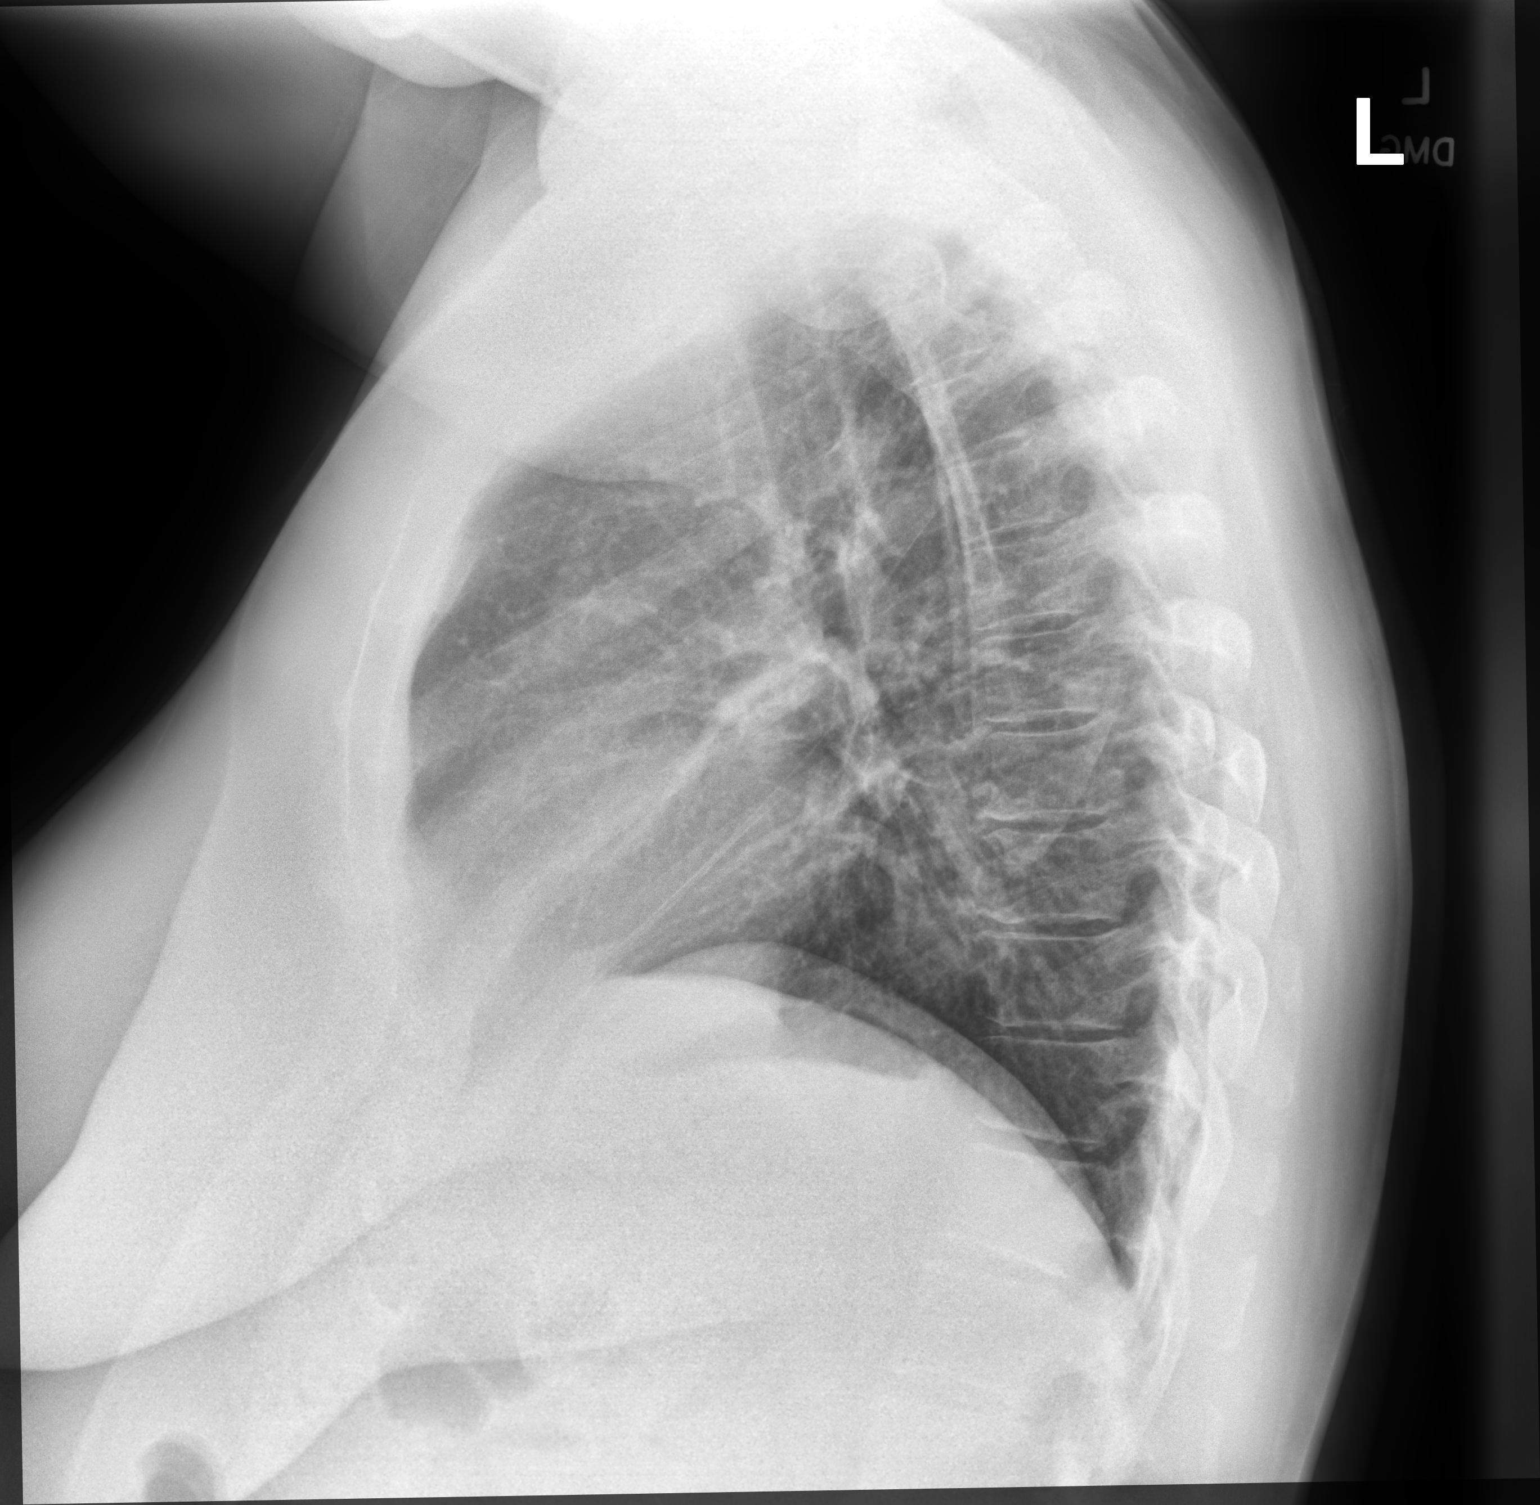

[2 of 2 positions shown; findings below may reference images not displayed]

FINDINGS: The heart size and mediastinal contours are within normal limits.
Both lungs are clear. The visualized skeletal structures are
unremarkable.
IMPRESSION: No active cardiopulmonary disease.

## 2018-08-22 DIAGNOSIS — Z6841 Body Mass Index (BMI) 40.0 and over, adult: Secondary | ICD-10-CM | POA: Diagnosis not present

## 2018-08-22 DIAGNOSIS — Z713 Dietary counseling and surveillance: Secondary | ICD-10-CM | POA: Diagnosis not present

## 2019-01-10 ENCOUNTER — Other Ambulatory Visit: Payer: Self-pay | Admitting: Family Medicine

## 2019-01-13 ENCOUNTER — Other Ambulatory Visit: Payer: Self-pay | Admitting: Family Medicine

## 2019-04-08 ENCOUNTER — Other Ambulatory Visit: Payer: Self-pay | Admitting: Family Medicine

## 2019-04-08 NOTE — Telephone Encounter (Signed)
Liane Comber can you help me get pt scheduled for an e-visit. Pt overdue to see Dr. Deborra Medina and needs e-visit before refilling.

## 2019-04-08 NOTE — Telephone Encounter (Signed)
Pt has CPE 05/14/19

## 2019-05-14 ENCOUNTER — Encounter: Payer: 59 | Admitting: Family Medicine

## 2019-06-17 NOTE — Progress Notes (Signed)
Subjective:   Patient ID: Jill Cruz, female    DOB: [birth year], 53 y.o.   MRN: [id number]  Jill Cruz is a pleasant 53 y.o. year old female who presents to clinic today with Annual Exam  on 06/18/2019  HPI:  Health Maintenance  Topic Date Due  . MAMMOGRAM  03/25/2017  . INFLUENZA VACCINE  05/03/2019  . PAP SMEAR-Modifier  07/20/2019  . COLONOSCOPY  01/03/2027  . TETANUS/TDAP  11/23/2027  . HIV Screening  Completed   Depression screen [facility] 2/9 06/18/2019 10/23/2017  Decreased Interest 0 0  Down, Depressed, Hopeless 0 0  PHQ - 2 Score 0 0     Has GYN- Dr. Pamala Hurry- Up to date on pap smear she states mammogram up to date as. Colonoscopy 01/02/17  Just saw her dermatologist for skin survey.  Feels zoloft is working well at current dose.  Depression screen [facility] 2/9 06/18/2019 10/23/2017  Decreased Interest 0 0  Down, Depressed, Hopeless 0 0  PHQ - 2 Score 0 0     BP has been well controlled with low dose lisinopril.  Denies HA, blurred vision, CP or SOB.  BP Readings from Last 3 Encounters:  06/18/19 126/78  07/24/18 124/82  06/25/18 124/84      Lab Results  Component Value Date   WBC 4.5 12/06/2017   HGB 13.3 12/06/2017   HCT 39.2 12/06/2017   MCV 86.5 12/06/2017   PLT 234.0 12/06/2017   Lab Results  Component Value Date   TSH 1.00 11/22/2017   Lab Results  Component Value Date   CHOL 144 11/22/2017   HDL 48.00 11/22/2017   LDLCALC 75 11/22/2017   TRIG 104.0 11/22/2017   CHOLHDL 3 11/22/2017     Current Outpatient Medications on File Prior to Visit  Medication Sig Dispense Refill  . albuterol (PROVENTIL HFA;VENTOLIN HFA) 108 (90 Base) MCG/ACT inhaler Inhale 1-2 puffs into the lungs every 6 (six) hours as needed. 1 Inhaler 0  . fluticasone (FLONASE) 50 MCG/ACT nasal spray Place 2 sprays into both nostrils daily. 48 g 0  . folic acid (FOLVITE) 1 MG tablet Take 4 tablets (4 mg total) by mouth daily. 120 tablet 2  . lisinopril (ZESTRIL)  10 MG tablet TAKE 1 TABLET BY MOUTH EVERY DAY 90 tablet 0  . loratadine (CLARITIN) 10 MG tablet Take 1 tablet (10 mg total) by mouth daily. 30 tablet 11  . nystatin-triamcinolone (MYCOLOG II) cream     . sertraline (ZOLOFT) 25 MG tablet TAKE 1 TABLET BY MOUTH EVERY DAY 90 tablet 1   No current facility-administered medications on file prior to visit.     Allergies  Allergen Reactions  . Codeine Nausea And Vomiting    Past Medical History:  Diagnosis Date  . Anxiety 2011   Post Partum Anxiety/Depression treated w/ medication. Now resolved.  . Family history of adverse reaction to anesthesia    Maternal aunt - PONV  . GERD (gastroesophageal reflux disease)    Mild baseline- worse w/pregnancy. Takes TUMS prn.  . History of pre-eclampsia in prior pregnancy, currently pregnant   . Hypertension    On Labetalol  . Motion sickness    cars, boats  . MTHFR mutation ([place]) 03/27/2013  . PONV (postoperative nausea and vomiting)    OK after colonoscopy with propofol  . Postpartum care following cesarean delivery (03/27/13) 03/27/2013  . S/P repeat low transverse C-section 03/30/2013    Past Surgical History:  Procedure Laterality Date  . CESAREAN SECTION  2011   at [facility]  . CESAREAN SECTION N/A 03/27/2013   Procedure: REPEAT CESAREAN SECTION;  Surgeon: Claiborne Billings A. Pamala Hurry, MD;  Location: [facility];  Service: Obstetrics;  Laterality: N/A;  Repeat C/S  EDD: 04/23/13  . CHOLECYSTECTOMY  ~2003   [facility] in [place]  . COLONOSCOPY WITH PROPOFOL N/A 01/02/2017   Procedure: COLONOSCOPY WITH PROPOFOL;  Surgeon: Lucilla Lame, MD;  Location: [facility];  Service: Endoscopy;  Laterality: N/A;  . DIAGNOSTIC LAPAROSCOPY Left ~2008   Ovarian Cyst removed- [facility]. in [place]  . KNEE ARTHROSCOPY W/ ACL RECONSTRUCTION AND PATELLA GRAFT Right 2000   [facility]. in [place]  . KNEE ARTHROSCOPY W/ PARTIAL MEDIAL MENISCECTOMY Right 2000    Family History  Problem Relation Age of Onset  . Hypertension Mother   .  Diabetes Mother   . Arthritis Mother   . Arthritis Father     Social History   Socioeconomic History  . Marital status: Married    Spouse name: Not on file  . Number of children: Not on file  . Years of education: Not on file  . Highest education level: Not on file  Occupational History  . Not on file  Social Needs  . Financial resource strain: Not on file  . Food insecurity    Worry: Not on file    Inability: Not on file  . Transportation needs    Medical: Not on file    Non-medical: Not on file  Tobacco Use  . Smoking status: Never Smoker  . Smokeless tobacco: Never Used  Substance and Sexual Activity  . Alcohol use: No    Alcohol/week: 0.0 standard drinks  . Drug use: No  . Sexual activity: Yes  Lifestyle  . Physical activity    Days per week: Not on file    Minutes per session: Not on file  . Stress: Not on file  Relationships  . Social Herbalist on phone: Not on file    Gets together: Not on file    Attends religious service: Not on file    Active member of club or organization: Not on file    Attends meetings of clubs or organizations: Not on file    Relationship status: Not on file  . Intimate partner violence    Fear of current or ex partner: Not on file    Emotionally abused: Not on file    Physically abused: Not on file    Forced sexual activity: Not on file  Other Topics Concern  . Not on file  Social History Narrative   Married.   One son, [name] (age 83).   [redacted] weeks pregnant- 11/2011.   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   The PMH, PSH, Social History, Family History, Medications, and allergies have been reviewed in [facility], and have been updated if relevant.   Review of Systems  Constitutional: Positive for fatigue.  HENT: Negative.   Respiratory: Negative.   Gastrointestinal: Negative for anal bleeding, blood in stool, constipation, diarrhea, nausea, rectal pain and vomiting.  Endocrine: Negative.   Genitourinary: Negative.    Musculoskeletal: Negative.   Neurological: Negative.   Hematological: Negative.   Psychiatric/Behavioral: Negative.   All other systems reviewed and are negative.      Objective:    BP 126/78   Pulse 90   Ht 5' 7.5" (1.715 m)   Wt 282 lb (127.9 kg)   LMP 08/04/2014   SpO2 99%   BMI 43.52  kg/m   Wt Readings from Last 3 Encounters:  06/18/19 282 lb (127.9 kg)  07/24/18 274 lb 9.6 oz (124.6 kg)  06/25/18 269 lb (122 kg)    Physical Exam  Constitutional: She is oriented to person, place, and time. She appears well-developed and well-nourished. No distress.  HENT:  Head: Normocephalic and atraumatic.  Eyes: Conjunctivae are normal.  Neck: Neck supple.  Cardiovascular: Normal rate and regular rhythm.  Pulmonary/Chest: Effort normal and breath sounds normal.  Abdominal: Soft. Bowel sounds are normal. She exhibits no distension and no mass. There is no abdominal tenderness. There is no rebound and no guarding.  Musculoskeletal: Normal range of motion.        General: No tenderness.  Neurological: She is alert and oriented to person, place, and time. No cranial nerve deficit.  Skin: Skin is warm and dry. She is not diaphoretic.  Psychiatric: She has a normal mood and affect. Her behavior is normal. Judgment and thought content normal.  Nursing note and vitals reviewed.        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

## 2019-06-18 ENCOUNTER — Encounter: Payer: Self-pay | Admitting: Family Medicine

## 2019-06-18 ENCOUNTER — Ambulatory Visit (INDEPENDENT_AMBULATORY_CARE_PROVIDER_SITE_OTHER): Payer: 59 | Admitting: Family Medicine

## 2019-06-18 ENCOUNTER — Other Ambulatory Visit: Payer: Self-pay

## 2019-06-18 VITALS — BP 126/78 | HR 90 | Ht 67.5 in | Wt 282.0 lb

## 2019-06-18 DIAGNOSIS — F4322 Adjustment disorder with anxiety: Secondary | ICD-10-CM

## 2019-06-18 DIAGNOSIS — D6859 Other primary thrombophilia: Secondary | ICD-10-CM | POA: Insufficient documentation

## 2019-06-18 DIAGNOSIS — E782 Mixed hyperlipidemia: Secondary | ICD-10-CM

## 2019-06-18 DIAGNOSIS — Z23 Encounter for immunization: Secondary | ICD-10-CM

## 2019-06-18 DIAGNOSIS — I10 Essential (primary) hypertension: Secondary | ICD-10-CM | POA: Diagnosis not present

## 2019-06-18 DIAGNOSIS — D688 Other specified coagulation defects: Secondary | ICD-10-CM

## 2019-06-18 DIAGNOSIS — Z0000 Encounter for general adult medical examination without abnormal findings: Secondary | ICD-10-CM | POA: Diagnosis not present

## 2019-06-18 LAB — CBC WITH DIFFERENTIAL/PLATELET
Basophils Absolute: 0 10*3/uL (ref 0.0–0.1)
Basophils Relative: 0.5 % (ref 0.0–3.0)
Eosinophils Absolute: 0.1 10*3/uL (ref 0.0–0.7)
Eosinophils Relative: 1.3 % (ref 0.0–5.0)
HCT: 39.5 % (ref 36.0–46.0)
Hemoglobin: 13.4 g/dL (ref 12.0–15.0)
Lymphocytes Relative: 29.4 % (ref 12.0–46.0)
Lymphs Abs: 2.4 10*3/uL (ref 0.7–4.0)
MCHC: 34.1 g/dL (ref 30.0–36.0)
MCV: 85 fl (ref 78.0–100.0)
Monocytes Absolute: 0.6 10*3/uL (ref 0.1–1.0)
Monocytes Relative: 7.6 % (ref 3.0–12.0)
Neutro Abs: 4.9 10*3/uL (ref 1.4–7.7)
Neutrophils Relative %: 61.2 % (ref 43.0–77.0)
Platelets: 307 10*3/uL (ref 150.0–400.0)
RBC: 4.64 Mil/uL (ref 3.87–5.11)
RDW: 13.7 % (ref 11.5–15.5)
WBC: 8.1 10*3/uL (ref 4.0–10.5)

## 2019-06-18 LAB — COMPREHENSIVE METABOLIC PANEL
ALT: 12 U/L (ref 0–35)
AST: 15 U/L (ref 0–37)
Albumin: 4.2 g/dL (ref 3.5–5.2)
Alkaline Phosphatase: 74 U/L (ref 39–117)
BUN: 10 mg/dL (ref 6–23)
CO2: 27 mEq/L (ref 19–32)
Calcium: 9.7 mg/dL (ref 8.4–10.5)
Chloride: 102 mEq/L (ref 96–112)
Creatinine, Ser: 0.69 mg/dL (ref 0.40–1.20)
GFR: 89.06 mL/min (ref >60.00)
Glucose, Bld: 87 mg/dL (ref 70–99)
Potassium: 4.3 mEq/L (ref 3.5–5.1)
Sodium: 136 mEq/L (ref 135–145)
Total Bilirubin: 0.4 mg/dL (ref 0.2–1.2)
Total Protein: 7.3 g/dL (ref 6.0–8.3)

## 2019-06-18 LAB — LIPID PANEL
Cholesterol: 141 mg/dL (ref 0–200)
HDL: 45.2 mg/dL (ref >39.00)
LDL Cholesterol: 66 mg/dL (ref 0–99)
NonHDL: 95.63
Total CHOL/HDL Ratio: 3
Triglycerides: 150 mg/dL — ABNORMAL HIGH (ref 0.0–149.0)
VLDL: 30 mg/dL (ref 0.0–40.0)

## 2019-06-18 LAB — TSH: TSH: 1.43 u[IU]/mL (ref 0.35–4.50)

## 2019-06-18 MED ORDER — SERTRALINE HCL 25 MG PO TABS: 25.0000 mg | ORAL_TABLET | Freq: Every day | ORAL | 3 refills | Status: DC

## 2019-06-18 MED ORDER — LISINOPRIL 10 MG PO TABS: ORAL_TABLET | ORAL | 3 refills | Status: DC

## 2019-06-18 NOTE — Assessment & Plan Note (Signed)
Well controlled on very low dose zoloft. No changes made eRx refills setn.

## 2019-06-18 NOTE — Assessment & Plan Note (Signed)
Diet controlled. Due for labs today.

## 2019-06-18 NOTE — Assessment & Plan Note (Signed)
Reviewed preventive care protocols, scheduled due services, and updated immunizations Discussed nutrition, exercise, diet, and healthy lifestyle.  Influenza vaccine given today. 

## 2019-06-18 NOTE — Assessment & Plan Note (Signed)
Well controlled on low dose lisinopril. eRX refills sent. Labs today.  Orders Placed This Encounter  Procedures  . CBC with Differential/Platelet  . Comprehensive metabolic panel  . Lipid panel  . TSH

## 2019-06-18 NOTE — Patient Instructions (Signed)
Great to see you.  I will call you with your lab results from today and you can view them online.   Please call Dr. Pamala Hurry about your mammogram.

## 2019-06-18 NOTE — Addendum Note (Signed)
Addended by: Nathanial Millman E on: 06/18/2019 11:09 AM   Modules accepted: Orders

## 2019-07-28 LAB — HM MAMMOGRAPHY

## 2019-07-29 LAB — HM PAP SMEAR: HM Pap smear: NEGATIVE

## 2019-12-25 ENCOUNTER — Ambulatory Visit: Payer: 59 | Attending: Internal Medicine

## 2019-12-25 DIAGNOSIS — Z23 Encounter for immunization: Secondary | ICD-10-CM

## 2019-12-25 NOTE — Progress Notes (Signed)
   Covid-19 Vaccination Clinic  Name:  DAVION ROMENESKO    MRN: [id number] DOB: 08/07/1966  12/25/2019  Ms. Macewen was observed post Covid-19 immunization for 15 minutes without incident. She was provided with Vaccine Information Sheet and instruction to access the V-Safe system.   Ms. Rougier was instructed to call 911 with any severe reactions post vaccine: Marland Kitchen Difficulty breathing  . Swelling of face and throat  . A fast heartbeat  . A bad rash all over body  . Dizziness and weakness   Immunizations Administered    Name Date Dose VIS Date Route   Pfizer COVID-19 Vaccine 12/25/2019  1:14 PM 0.3 mL 09/12/2019 Intramuscular   Manufacturer: [place]   Lot: [id number]   [place]: [id number]

## 2020-01-19 ENCOUNTER — Ambulatory Visit: Payer: 59 | Attending: Internal Medicine

## 2020-01-19 DIAGNOSIS — Z23 Encounter for immunization: Secondary | ICD-10-CM

## 2020-01-19 NOTE — Progress Notes (Signed)
   Covid-19 Vaccination Clinic  Name:  Jill Cruz    MRN: [id number] DOB: 1966-02-22  01/19/2020  [name] was observed post Covid-19 immunization for 15 minutes without incident. She was provided with Vaccine Information Sheet and instruction to access the V-Safe system.   [name] was instructed to call 911 with any severe reactions post vaccine: Marland Kitchen Difficulty breathing  . Swelling of face and throat  . A fast heartbeat  . A bad rash all over body  . Dizziness and weakness   Immunizations Administered    Name Date Dose VIS Date Route   Pfizer COVID-19 Vaccine 01/19/2020 11:39 AM 0.3 mL 11/26/2018 Intramuscular   Manufacturer: [place]   Lot: [id number]   [place]: [id number]

## 2020-06-22 ENCOUNTER — Encounter: Payer: Self-pay | Admitting: Family Medicine

## 2020-06-22 ENCOUNTER — Other Ambulatory Visit: Payer: Self-pay

## 2020-06-22 ENCOUNTER — Ambulatory Visit: Payer: 59 | Admitting: Family Medicine

## 2020-06-22 VITALS — BP 150/100 | HR 67 | Temp 97.5°F | Ht 66.5 in | Wt 253.0 lb

## 2020-06-22 DIAGNOSIS — Z1159 Encounter for screening for other viral diseases: Secondary | ICD-10-CM

## 2020-06-22 DIAGNOSIS — Z23 Encounter for immunization: Secondary | ICD-10-CM

## 2020-06-22 DIAGNOSIS — I10 Essential (primary) hypertension: Secondary | ICD-10-CM

## 2020-06-22 DIAGNOSIS — R5383 Other fatigue: Secondary | ICD-10-CM | POA: Diagnosis not present

## 2020-06-22 DIAGNOSIS — E559 Vitamin D deficiency, unspecified: Secondary | ICD-10-CM | POA: Diagnosis not present

## 2020-06-22 DIAGNOSIS — E782 Mixed hyperlipidemia: Secondary | ICD-10-CM | POA: Diagnosis not present

## 2020-06-22 LAB — CBC
HCT: 40.5 % (ref 36.0–46.0)
Hemoglobin: 13.7 g/dL (ref 12.0–15.0)
MCHC: 33.7 g/dL (ref 30.0–36.0)
MCV: 86.9 fl (ref 78.0–100.0)
Platelets: 293 10*3/uL (ref 150.0–400.0)
RBC: 4.66 Mil/uL (ref 3.87–5.11)
RDW: 13.4 % (ref 11.5–15.5)
WBC: 7.6 10*3/uL (ref 4.0–10.5)

## 2020-06-22 LAB — COMPREHENSIVE METABOLIC PANEL
ALT: 22 U/L (ref 0–35)
AST: 17 U/L (ref 0–37)
Albumin: 4.2 g/dL (ref 3.5–5.2)
Alkaline Phosphatase: 74 U/L (ref 39–117)
BUN: 13 mg/dL (ref 6–23)
CO2: 28 mEq/L (ref 19–32)
Calcium: 9.7 mg/dL (ref 8.4–10.5)
Chloride: 104 mEq/L (ref 96–112)
Creatinine, Ser: 0.68 mg/dL (ref 0.40–1.20)
GFR: 90.22 mL/min (ref >60.00)
Glucose, Bld: 86 mg/dL (ref 70–99)
Potassium: 4.5 mEq/L (ref 3.5–5.1)
Sodium: 138 mEq/L (ref 135–145)
Total Bilirubin: 0.4 mg/dL (ref 0.2–1.2)
Total Protein: 7 g/dL (ref 6.0–8.3)

## 2020-06-22 LAB — LIPID PANEL
Cholesterol: 140 mg/dL (ref 0–200)
HDL: 49 mg/dL (ref >39.00)
LDL Cholesterol: 62 mg/dL (ref 0–99)
NonHDL: 91.09
Total CHOL/HDL Ratio: 3
Triglycerides: 145 mg/dL (ref 0.0–149.0)
VLDL: 29 mg/dL (ref 0.0–40.0)

## 2020-06-22 LAB — VITAMIN D 25 HYDROXY (VIT D DEFICIENCY, FRACTURES): VITD: 22.63 ng/mL — ABNORMAL LOW (ref 30.00–100.00)

## 2020-06-22 LAB — FERRITIN: Ferritin: 26.6 ng/mL (ref 10.0–291.0)

## 2020-06-22 MED ORDER — LISINOPRIL 10 MG PO TABS: ORAL_TABLET | ORAL | 3 refills | Status: DC

## 2020-06-22 NOTE — Assessment & Plan Note (Signed)
Blood work today to evaluate. Not on medication. Cont exercise, diet.

## 2020-06-22 NOTE — Progress Notes (Signed)
Subjective:     Jill Cruz is a 54 y.o. female presenting for Establish Care (no concerns)     HPI   #HTN - stopped her bp medication 1 month - lost weight and was getting lightheaded - more with position changes - does not check bp at home - no cp, breathing difficulty - does have some HA - mostly tension - started optivia program and lost 38 lbs and stopped recently - regained about 8 lbs in the last 2 months - does well during the week but harder on the weekends - water - 30 oz of water daily    Review of Systems   Social History   Tobacco Use  Smoking Status Never Smoker  Smokeless Tobacco Never Used        Objective:    BP Readings from Last 3 Encounters:  06/22/20 (!) 150/100  06/18/19 126/78  07/24/18 124/82   Wt Readings from Last 3 Encounters:  06/22/20 253 lb (114.8 kg)  06/18/19 282 lb (127.9 kg)  07/24/18 274 lb 9.6 oz (124.6 kg)    BP (!) 150/100   Pulse 67   Temp (!) 97.5 F (36.4 C) (Temporal)   Ht 5' 6.5" (1.689 m)   Wt 253 lb (114.8 kg)   LMP 08/04/2014   SpO2 98%   BMI 40.22 kg/m    Physical Exam Constitutional:      General: She is not in acute distress.    Appearance: She is well-developed. She is not diaphoretic.  HENT:     Right Ear: External ear normal.     Left Ear: External ear normal.  Eyes:     Conjunctiva/sclera: Conjunctivae normal.  Cardiovascular:     Rate and Rhythm: Normal rate and regular rhythm.     Heart sounds: No murmur heard.   Pulmonary:     Effort: Pulmonary effort is normal. No respiratory distress.     Breath sounds: Normal breath sounds. No wheezing.  Musculoskeletal:     Cervical back: Neck supple.  Skin:    General: Skin is warm and dry.     Capillary Refill: Capillary refill takes less than 2 seconds.  Neurological:     Mental Status: She is alert. Mental status is at baseline.  Psychiatric:        Mood and Affect: Mood normal.        Behavior: Behavior normal.            Assessment & Plan:   Problem List Items Addressed This Visit      Cardiovascular and Mediastinum   HTN (hypertension) - Primary    BP elevated in setting of stopping medication. Restart lisinopril. Mychart in 2 weeks with home monitoring.       Relevant Medications   lisinopril (ZESTRIL) 10 MG tablet   Other Relevant Orders   Comprehensive metabolic panel     Other   Fatigue    Pt with ongoing fatigue. Hx of low vit d so will repeat. Also discussed low ferritin and fatigue so will check this.       Relevant Orders   CBC   Ferritin   Vitamin D deficiency   Relevant Orders   Vitamin D, 25-hydroxy   Mixed hyperlipidemia    Blood work today to evaluate. Not on medication. Cont exercise, diet.       Relevant Medications   lisinopril (ZESTRIL) 10 MG tablet   Other Relevant Orders   Lipid panel    Other  Visit Diagnoses    Need for influenza vaccination       Relevant Orders   Flu Vaccine QUAD 36+ mos IM (Completed)   Need for hepatitis C screening test       Relevant Orders   Hepatitis C antibody       Return if symptoms worsen or fail to improve.  [name], MD  This visit occurred during the SARS-CoV-2 public health emergency.  Safety protocols were in place, including screening questions prior to the visit, additional usage of staff PPE, and extensive cleaning of exam room while observing appropriate contact time as indicated for disinfecting solutions.

## 2020-06-22 NOTE — Patient Instructions (Signed)
Restart medication - continue diet and exercise as much as able  MyChart message - in a few weeks with home blood pressure

## 2020-06-22 NOTE — Assessment & Plan Note (Signed)
BP elevated in setting of stopping medication. Restart lisinopril. Mychart in 2 weeks with home monitoring.

## 2020-06-22 NOTE — Assessment & Plan Note (Signed)
Pt with ongoing fatigue. Hx of low vit d so will repeat. Also discussed low ferritin and fatigue so will check this.

## 2020-06-23 ENCOUNTER — Other Ambulatory Visit: Payer: Self-pay | Admitting: Family Medicine

## 2020-06-23 DIAGNOSIS — E611 Iron deficiency: Secondary | ICD-10-CM

## 2020-06-23 DIAGNOSIS — E559 Vitamin D deficiency, unspecified: Secondary | ICD-10-CM

## 2020-06-23 LAB — HEPATITIS C ANTIBODY
Hepatitis C Ab: NONREACTIVE
SIGNAL TO CUT-OFF: 0.05 (ref <1.00)

## 2020-06-23 MED ORDER — IRON (FERROUS SULFATE) 325 (65 FE) MG PO TABS: 1.0000 | ORAL_TABLET | Freq: Every day | ORAL | 1 refills | Status: DC

## 2020-06-23 MED ORDER — VITAMIN D (ERGOCALCIFEROL) 1.25 MG (50000 UNIT) PO CAPS: 50000.0000 [IU] | ORAL_CAPSULE | ORAL | 1 refills | Status: DC

## 2020-07-06 ENCOUNTER — Encounter: Payer: Self-pay | Admitting: Family Medicine

## 2020-07-07 ENCOUNTER — Other Ambulatory Visit: Payer: Self-pay | Admitting: Family Medicine

## 2020-07-07 DIAGNOSIS — E559 Vitamin D deficiency, unspecified: Secondary | ICD-10-CM

## 2020-07-23 ENCOUNTER — Encounter: Payer: Self-pay | Admitting: Family Medicine

## 2020-08-01 ENCOUNTER — Other Ambulatory Visit: Payer: Self-pay | Admitting: Family Medicine

## 2020-08-01 DIAGNOSIS — E559 Vitamin D deficiency, unspecified: Secondary | ICD-10-CM

## 2020-10-04 ENCOUNTER — Encounter: Payer: Self-pay | Admitting: Family Medicine

## 2020-10-04 ENCOUNTER — Telehealth (INDEPENDENT_AMBULATORY_CARE_PROVIDER_SITE_OTHER): Payer: 59 | Admitting: Family Medicine

## 2020-10-04 DIAGNOSIS — J40 Bronchitis, not specified as acute or chronic: Secondary | ICD-10-CM | POA: Diagnosis not present

## 2020-10-04 MED ORDER — PREDNISONE 20 MG PO TABS: ORAL_TABLET | ORAL | 0 refills | Status: AC

## 2020-10-04 NOTE — Progress Notes (Signed)
    I connected with Jill Cruz on 10/04/20 at  3:20 PM EST by video and verified that I am speaking with the correct person using two identifiers.   I discussed the limitations, risks, security and privacy concerns of performing an evaluation and management service by video and the availability of in person appointments. I also discussed with the patient that there may be a patient responsible charge related to this service. The patient expressed understanding and agreed to proceed.  Patient location: Home Provider Location: [facility] Participants: Lynnda Child and Jon Gills   Subjective:     Jill Cruz is a 55 y.o. female presenting for Cough     Cough This is a new problem. Episode onset: 08/25/2020. The problem occurs constantly. The cough is non-productive. Associated symptoms include nasal congestion and postnasal drip. Pertinent negatives include no chest pain or shortness of breath.   Bad sore throat before christmas Low grade fever which resolved  Started feeling better a few days ago Continues to get tickle in throat and will cough and have a hard time stopping  Family thinks she sounds like she did when she had pneumonia a few years ago  Kids sick No loss of taste or smell  Review of Systems  HENT: Positive for postnasal drip.   Respiratory: Positive for cough and chest tightness. Negative for shortness of breath.   Cardiovascular: Negative for chest pain.  Gastrointestinal: Positive for diarrhea (last few days). Negative for nausea and vomiting.     Social History   Tobacco Use  Smoking Status Never Smoker  Smokeless Tobacco Never Used        Objective:   BP Readings from Last 3 Encounters:  06/22/20 (!) 150/100  06/18/19 126/78  07/24/18 124/82   Wt Readings from Last 3 Encounters:  06/22/20 253 lb (114.8 kg)  06/18/19 282 lb (127.9 kg)  07/24/18 274 lb 9.6 oz (124.6 kg)    LMP 08/04/2014    Physical  Exam  Speaking in complete sentences Hacking cough intermittent       Assessment & Plan:   Problem List Items Addressed This Visit   None   Visit Diagnoses    Bronchitis    -  Primary   Relevant Medications   predniSONE (DELTASONE) 20 MG tablet     Reviewed prior hx - pt treated in 2019, no XR for bronchitis - with steroid and azithromycin.  She notes wheezing so discussed likely still viral but with wheezing and chest tightness could try steroids which were prescribed  Discussed 6 weeks is common but could consider cxr if not improved  Outside of isolation window so covid testing would likely not change management.    Interactive audio and video telecommunications were attempted between this provider and patient, however failed, due to patient having technical difficulties OR patient did not have access to video capability.  We continued and completed visit with audio only.   Start Time: 2:54 End Time: 3:03    Return if symptoms worsen or fail to improve.  Lynnda Child, MD

## 2020-10-06 ENCOUNTER — Encounter: Payer: Self-pay | Admitting: Internal Medicine

## 2020-10-06 ENCOUNTER — Other Ambulatory Visit: Payer: Self-pay

## 2020-10-06 ENCOUNTER — Ambulatory Visit: Payer: 59 | Admitting: Internal Medicine

## 2020-10-06 VITALS — BP 144/84 | HR 68 | Temp 98.6°F | Wt 268.0 lb

## 2020-10-06 DIAGNOSIS — R310 Gross hematuria: Secondary | ICD-10-CM

## 2020-10-06 DIAGNOSIS — R3915 Urgency of urination: Secondary | ICD-10-CM

## 2020-10-06 DIAGNOSIS — B379 Candidiasis, unspecified: Secondary | ICD-10-CM

## 2020-10-06 DIAGNOSIS — R3989 Other symptoms and signs involving the genitourinary system: Secondary | ICD-10-CM

## 2020-10-06 DIAGNOSIS — T3695XA Adverse effect of unspecified systemic antibiotic, initial encounter: Secondary | ICD-10-CM

## 2020-10-06 LAB — POC URINALSYSI DIPSTICK (AUTOMATED)
Glucose, UA: POSITIVE — AB
Ketones, UA: NEGATIVE
Nitrite, UA: POSITIVE
Protein, UA: NEGATIVE
Spec Grav, UA: 1.015 (ref 1.010–1.025)
Urobilinogen, UA: 1 E.U./dL
pH, UA: 6 (ref 5.0–8.0)

## 2020-10-06 MED ORDER — FLUCONAZOLE 150 MG PO TABS: 150.0000 mg | ORAL_TABLET | Freq: Once | ORAL | 0 refills | Status: AC

## 2020-10-06 MED ORDER — NITROFURANTOIN MONOHYD MACRO 100 MG PO CAPS: 100.0000 mg | ORAL_CAPSULE | Freq: Two times a day (BID) | ORAL | 0 refills | Status: DC

## 2020-10-06 NOTE — Progress Notes (Signed)
HPI  Pt presents to the clinic today with c/o bladder pressure, urinary urgency and blood in her urine.  She noticed this yesterday.  She reports her symptoms worsened overnight.  She denies frequency, dysuria, low back pain, vaginal complaints, fever, chills or nausea.  She has tried AZO OTC with some relief of symptoms.   Review of Systems  Past Medical History:  Diagnosis Date  . Anxiety 2011   Post Partum Anxiety/Depression treated w/ medication. Now resolved.  . Family history of adverse reaction to anesthesia    Maternal aunt - PONV  . GERD (gastroesophageal reflux disease)    Mild baseline- worse w/pregnancy. Takes TUMS prn.  . History of pre-eclampsia in prior pregnancy, currently pregnant   . Hypertension    On Labetalol  . Motion sickness    cars, boats  . MTHFR mutation 03/27/2013  . PONV (postoperative nausea and vomiting)    OK after colonoscopy with propofol  . Postpartum care following cesarean delivery (03/27/13) 03/27/2013  . S/P repeat low transverse C-section 03/30/2013    Family History  Problem Relation Age of Onset  . Hypertension Mother   . Diabetes Mother   . Arthritis Mother   . Arthritis Father   . Hypertension Maternal Grandmother     Social History   Socioeconomic History  . Marital status: Married    Spouse name: Thurmond Butts  . Number of children: 3  . Years of education: masters  . Highest education level: Not on file  Occupational History  . Not on file  Tobacco Use  . Smoking status: Never Smoker  . Smokeless tobacco: Never Used  Vaping Use  . Vaping Use: Never used  Substance and Sexual Activity  . Alcohol use: Never    Alcohol/week: 0.0 standard drinks  . Drug use: No  . Sexual activity: Yes    Birth control/protection: Post-menopausal  Other Topics Concern  . Not on file  Social History Narrative   Married.   One son, [name] (age 49).   [redacted] weeks pregnant- 11/2011.   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      06/22/20   From: [facility]  originally - met her husband in [place]: with husband, Thurmond Butts (2005) - MIL, and 4 children   Work: Information systems manager for a comp in [place]      Family: 4 children - adopted Raelyn (2018), Rider (2014), Rylie (2011), Josie (foster daughter 2009)      Enjoys: spend time with family, basketball, swimming - membership to a pool      Exercise: not currently, just family exercise   Diet: optivia Building control surveyor belts: Yes    Guns: Yes  and secure   Safe in relationships: Yes    Social Determinants of Radio broadcast assistant Strain: Not on file  Food Insecurity: Not on file  Transportation Needs: Not on file  Physical Activity: Not on file  Stress: Not on file  Social Connections: Not on file  Intimate Partner Violence: Not on file    Allergies  Allergen Reactions  . Codeine Nausea And Vomiting     Constitutional: Denies fever, malaise, fatigue, headache or abrupt weight changes.   GU: Pt reports bladder pressure, urgency and blood in her urine. Denies frequency, burning sensation, odor or discharge. Skin: Denies redness, rashes, lesions or ulcercations.   No other specific complaints in a complete review of systems (except as listed in HPI  above).    Objective:   Physical Exam  BP (!) 144/84   Pulse 68   Temp 98.6 F (37 C) (Temporal)   Wt 268 lb (121.6 kg)   LMP 08/04/2014   SpO2 98%   BMI 42.61 kg/m   Wt Readings from Last 3 Encounters:  06/22/20 253 lb (114.8 kg)  06/18/19 282 lb (127.9 kg)  07/24/18 274 lb 9.6 oz (124.6 kg)    General: Appears her stated age, obese in NAD. Cardiovascular: Normal rate and rhythm. S1,S2 noted.   Pulmonary/Chest: Normal effort and positive vesicular breath sounds. No respiratory distress. No wheezes, rales or ronchi noted.  Abdomen:  Tender to palpation over the bladder area. No CVA tenderness.        Assessment & Plan:   Bladder Pressure, Urgency and Blood in urine:  Urinalysis: 2+  leuks, 1+ blood Will send urine culture eRx sent if for Macrobid 100 mg BID x 5 days Rx for Diflucan 150 mg p.o. x1 for antibiotic induced yeast infection OK to take AZO OTC Drink plenty of fluids  RTC as needed or if symptoms persist. Webb Silversmith, NP This visit occurred during the SARS-CoV-2 public health emergency.  Safety protocols were in place, including screening questions prior to the visit, additional usage of staff PPE, and extensive cleaning of exam room while observing appropriate contact time as indicated for disinfecting solutions.

## 2020-10-07 ENCOUNTER — Encounter: Payer: Self-pay | Admitting: Internal Medicine

## 2020-10-07 NOTE — Patient Instructions (Signed)
Urinary Tract Infection, Adult A urinary tract infection (UTI) is an infection of any part of the urinary tract. The urinary tract includes:  The kidneys.  The ureters.  The bladder.  The urethra. These organs make, store, and get rid of pee (urine) in the body. What are the causes? This is caused by germs (bacteria) in your genital area. These germs grow and cause swelling (inflammation) of your urinary tract. What increases the risk? You are more likely to develop this condition if:  You have a small, thin tube (catheter) to drain pee.  You cannot control when you pee or poop (incontinence).  You are female, and: ? You use these methods to prevent pregnancy:  A medicine that kills sperm (spermicide).  A device that blocks sperm (diaphragm). ? You have low levels of a female hormone (estrogen). ? You are pregnant.  You have genes that add to your risk.  You are sexually active.  You take antibiotic medicines.  You have trouble peeing because of: ? A prostate that is bigger than normal, if you are female. ? A blockage in the part of your body that drains pee from the bladder (urethra). ? A kidney stone. ? A nerve condition that affects your bladder (neurogenic bladder). ? Not getting enough to drink. ? Not peeing often enough.  You have other conditions, such as: ? Diabetes. ? A weak disease-fighting system (immune system). ? Sickle cell disease. ? Gout. ? Injury of the spine. What are the signs or symptoms? Symptoms of this condition include:  Needing to pee right away (urgently).  Peeing often.  Peeing small amounts often.  Pain or burning when peeing.  Blood in the pee.  Pee that smells bad or not like normal.  Trouble peeing.  Pee that is cloudy.  Fluid coming from the vagina, if you are female.  Pain in the belly or lower back. Other symptoms include:  Throwing up (vomiting).  No urge to eat.  Feeling mixed up (confused).  Being tired  and grouchy (irritable).  A fever.  Watery poop (diarrhea). How is this treated? This condition may be treated with:  Antibiotic medicine.  Other medicines.  Drinking enough water. Follow these instructions at home:  Medicines  Take over-the-counter and prescription medicines only as told by your doctor.  If you were prescribed an antibiotic medicine, take it as told by your doctor. Do not stop taking it even if you start to feel better. General instructions  Make sure you: ? Pee until your bladder is empty. ? Do not hold pee for a long time. ? Empty your bladder after sex. ? Wipe from front to back after pooping if you are a female. Use each tissue one time when you wipe.  Drink enough fluid to keep your pee pale yellow.  Keep all follow-up visits as told by your doctor. This is important. Contact a doctor if:  You do not get better after 1-2 days.  Your symptoms go away and then come back. Get help right away if:  You have very bad back pain.  You have very bad pain in your lower belly.  You have a fever.  You are sick to your stomach (nauseous).  You are throwing up. Summary  A urinary tract infection (UTI) is an infection of any part of the urinary tract.  This condition is caused by germs in your genital area.  There are many risk factors for a UTI. These include having a small, thin   tube to drain pee and not being able to control when you pee or poop.  Treatment includes antibiotic medicines for germs.  Drink enough fluid to keep your pee pale yellow. This information is not intended to replace advice given to you by your health care provider. Make sure you discuss any questions you have with your health care provider. Document Revised: 09/05/2018 Document Reviewed: 03/28/2018 Elsevier Patient Education  2020 Elsevier Inc.  

## 2020-10-08 LAB — URINE CULTURE
MICRO NUMBER:: 11385043
SPECIMEN QUALITY:: ADEQUATE

## 2020-10-13 DIAGNOSIS — I10 Essential (primary) hypertension: Secondary | ICD-10-CM | POA: Insufficient documentation

## 2020-10-13 DIAGNOSIS — N809 Endometriosis, unspecified: Secondary | ICD-10-CM | POA: Insufficient documentation

## 2020-10-13 DIAGNOSIS — N83209 Unspecified ovarian cyst, unspecified side: Secondary | ICD-10-CM | POA: Insufficient documentation

## 2021-04-22 ENCOUNTER — Other Ambulatory Visit (HOSPITAL_BASED_OUTPATIENT_CLINIC_OR_DEPARTMENT_OTHER): Payer: Self-pay

## 2021-04-22 DIAGNOSIS — R0683 Snoring: Secondary | ICD-10-CM

## 2021-06-25 ENCOUNTER — Encounter: Payer: Self-pay | Admitting: Family Medicine

## 2021-07-06 ENCOUNTER — Other Ambulatory Visit: Payer: Self-pay

## 2021-07-06 ENCOUNTER — Ambulatory Visit: Payer: 59 | Admitting: Family Medicine

## 2021-07-06 VITALS — BP 162/110 | HR 72 | Temp 96.2°F | Ht 66.5 in | Wt 269.1 lb

## 2021-07-06 DIAGNOSIS — Z23 Encounter for immunization: Secondary | ICD-10-CM | POA: Diagnosis not present

## 2021-07-06 DIAGNOSIS — Z0000 Encounter for general adult medical examination without abnormal findings: Secondary | ICD-10-CM | POA: Diagnosis not present

## 2021-07-06 DIAGNOSIS — E782 Mixed hyperlipidemia: Secondary | ICD-10-CM

## 2021-07-06 DIAGNOSIS — I10 Essential (primary) hypertension: Secondary | ICD-10-CM | POA: Diagnosis not present

## 2021-07-06 DIAGNOSIS — G4733 Obstructive sleep apnea (adult) (pediatric): Secondary | ICD-10-CM

## 2021-07-06 LAB — LIPID PANEL
Cholesterol: 145 mg/dL (ref 0–200)
HDL: 47 mg/dL (ref >39.00)
LDL Cholesterol: 59 mg/dL (ref 0–99)
NonHDL: 97.63
Total CHOL/HDL Ratio: 3
Triglycerides: 193 mg/dL — ABNORMAL HIGH (ref 0.0–149.0)
VLDL: 38.6 mg/dL (ref 0.0–40.0)

## 2021-07-06 LAB — COMPREHENSIVE METABOLIC PANEL
ALT: 24 U/L (ref 0–35)
AST: 14 U/L (ref 0–37)
Albumin: 4.1 g/dL (ref 3.5–5.2)
Alkaline Phosphatase: 75 U/L (ref 39–117)
BUN: 11 mg/dL (ref 6–23)
CO2: 28 mEq/L (ref 19–32)
Calcium: 9.7 mg/dL (ref 8.4–10.5)
Chloride: 104 mEq/L (ref 96–112)
Creatinine, Ser: 0.75 mg/dL (ref 0.40–1.20)
GFR: 89.97 mL/min (ref >60.00)
Glucose, Bld: 77 mg/dL (ref 70–99)
Potassium: 4.3 mEq/L (ref 3.5–5.1)
Sodium: 140 mEq/L (ref 135–145)
Total Bilirubin: 0.4 mg/dL (ref 0.2–1.2)
Total Protein: 6.8 g/dL (ref 6.0–8.3)

## 2021-07-06 LAB — HEMOGLOBIN A1C: Hgb A1c MFr Bld: 5.7 % (ref 4.6–6.5)

## 2021-07-06 MED ORDER — LISINOPRIL 10 MG PO TABS: ORAL_TABLET | ORAL | 3 refills | Status: DC

## 2021-07-06 NOTE — Patient Instructions (Signed)
Your blood pressure high.   High blood pressure increases your risk for heart attack and stroke.    Please check your blood pressure 2-4 times a week.   To check your blood pressure 1) Sit in a quiet and relaxed place for 5 minutes 2) Make sure your feet are flat on the ground 3) Consider checking first thing in the morning   Normal blood pressure is less than 140/90 Ideally you blood pressure should be around 120/80  Other ways you can reduce your blood pressure:  1) Regular exercise -- Try to get 150 minutes (30 minutes, 5 days a week) of moderate to vigorous aerobic excercise -- Examples: brisk walking (2.5 miles per hour), water aerobics, dancing, gardening, tennis, biking slower than 10 miles per hour 2) DASH Diet - low fat meats, more fresh fruits and vegetables, whole grains, low salt 3) Quit smoking if you smoke 4) Loose 5-10% of your body weight

## 2021-07-06 NOTE — Progress Notes (Signed)
Annual Exam   Chief Complaint:  Chief Complaint  Patient presents with   Annual Exam    Pt has been out of BP med for 1.5 week    History of Present Illness:  [name] is a 55 y.o. [id number] who LMP was Patient's last menstrual period was 08/04/2014., presents today for her annual examination.    Tired all the time Suggested sleep study from the ob/gyn Sleepiness Did a sleep study - at [facility] and diagnosed with OSA  Nutrition She does get adequate calcium and Vitamin D in her diet. Diet: salad, broccoli, yogurt - generally healthy Exercise: not currently, does some knee PT - kickboxing video-games with kids  Safety The patient wears seatbelts: yes.     The patient feels safe at home and in their relationships: yes.  Dentist: yes Eye: yes  Menstrual:  Symptoms of menopause: not currently, rare hot flashes  GYN She is single partner, contraception - post menopausal status.    Cervical Cancer Screening (21-65):   Last Pap:   October 2020 Results were: no abnormalities /neg HPV DNA   Breast Cancer Screening (Age 88-74):  There is no FH of breast cancer. There is no FH of ovarian cancer. BRCA screening Not Indicated.  Last Mammogram: 07/2019 The patient does want a mammogram this year.    Colon Cancer Screening:  Age 88-75 yo - benefits outweigh the risk. Adults 75-85 yo who have never been screened benefit.  Benefits: 134000 people in 2016 will be diagnosed and 49,000 will die - early detection helps [place]: Complications 2/2 to colonoscopy High Risk (Colonoscopy): genetic disorder (Lynch syndrome or familial adenomatous polyposis), personal hx of IBD, previous adenomatous polyp, or previous colorectal cancer, FamHx start 10 years before the age at diagnosis, increased in males and black race  Options:  FIT - looks for hemoglobin (blood in the stool) - specific and fairly sensitive - must be done annually Cologuard - looks for DNA and blood - more sensitive  - therefore can have more false positives, every 3 years Colonoscopy - every 10 years if normal - sedation, bowl prep, must have someone drive you  Shared decision making and the patient had decided to do Colonoscopy - due 2028.   Social History   Tobacco Use  Smoking Status Never  Smokeless Tobacco Never    Lung Cancer Screening (Ages 29-79): not applicable   Weight Wt Readings from Last 3 Encounters:  07/06/21 269 lb 2 oz (122.1 kg)  10/06/20 268 lb (121.6 kg)  06/22/20 253 lb (114.8 kg)   Patient has very high BMI  BMI Readings from Last 1 Encounters:  07/06/21 42.79 kg/m     Chronic disease screening Blood pressure monitoring:  BP Readings from Last 3 Encounters:  07/06/21 (!) 162/110  10/06/20 (!) 144/84  06/22/20 (!) 150/100    Lipid Monitoring: Indication for screening: age >25, obesity, diabetes, family hx, CV risk factors.  Lipid screening: Yes  Lab Results  Component Value Date   CHOL 140 06/22/2020   HDL 49.00 06/22/2020   LDLCALC 62 06/22/2020   TRIG 145.0 06/22/2020   CHOLHDL 3 06/22/2020     Diabetes Screening: age >35, overweight, family hx, PCOS, hx of gestational diabetes, at risk ethnicity Diabetes Screening screening: Yes  No results found for: HGBA1C   Past Medical History:  Diagnosis Date   Anxiety 2011   Post Partum Anxiety/Depression treated w/ medication. Now resolved.   Family history of adverse reaction to anesthesia  Maternal aunt - PONV   GERD (gastroesophageal reflux disease)    Mild baseline- worse w/pregnancy. Takes TUMS prn.   History of pre-eclampsia in prior pregnancy, currently pregnant    Hypertension    On Labetalol   Motion sickness    cars, boats   MTHFR mutation 03/27/2013   PONV (postoperative nausea and vomiting)    OK after colonoscopy with propofol   Postpartum care following cesarean delivery (03/27/13) 03/27/2013   S/P repeat low transverse C-section 03/30/2013    Past Surgical History:  Procedure  Laterality Date   CESAREAN SECTION  2011   at [place] N/A 03/27/2013   Procedure: REPEAT CESAREAN SECTION;  Surgeon: Claiborne Billings A. Pamala Hurry, MD;  Location: [facility];  Service: Obstetrics;  Laterality: N/A;  Repeat C/S  EDD: 04/23/13   CHOLECYSTECTOMY  ~2003   [facility] in [place] N/A 01/02/2017   Procedure: COLONOSCOPY WITH PROPOFOL;  Surgeon: Lucilla Lame, MD;  Location: [facility];  Service: Endoscopy;  Laterality: N/A;   DIAGNOSTIC LAPAROSCOPY Left ~2008   Ovarian Cyst removed- [facility]. in [place] Right 2000   [facility]. in [place] Right 2000    Prior to Admission medications   Medication Sig Start Date End Date Taking? Authorizing Provider  fluticasone (FLONASE) 50 MCG/ACT nasal spray Place 2 sprays into both nostrils daily. Patient taking differently: Place 2 sprays into both nostrils as needed. 03/13/18  Yes Libby Maw, MD  lisinopril (ZESTRIL) 10 MG tablet TAKE 1 TABLET BY MOUTH EVERY DAY 06/22/20  Yes [name], MD  meloxicam (MOBIC) 7.5 MG tablet Take 7.5 mg by mouth as needed. 04/29/21  Yes [provider]  nystatin-triamcinolone (MYCOLOG II) cream Apply 1 application topically as needed.  11/07/16  Yes [provider]  phentermine (ADIPEX-P) 37.5 MG tablet Take 37.5 mg by mouth daily. 06/21/21  Yes [provider]    Allergies  Allergen Reactions   Codeine Nausea And Vomiting    Gynecologic History: Patient's last menstrual period was 08/04/2014.  Obstetric History: G3P0202  Social History   Socioeconomic History   Marital status: Married    Spouse name: Thurmond Butts   Number of children: 3   Years of education: masters   Highest education level: Not on file  Occupational History   Not on file  Tobacco Use   Smoking status: Never   Smokeless tobacco: Never  Vaping Use   Vaping Use: Never  used  Substance and Sexual Activity   Alcohol use: Never    Alcohol/week: 0.0 standard drinks   Drug use: No   Sexual activity: Yes    Birth control/protection: Post-menopausal  Other Topics Concern   Not on file  Social History Narrative   Married.   One son, [name] (age 2).   [redacted] weeks pregnant- 11/2011.   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      06/22/20   From: [facility] originally - met her husband in [place]: with husband, Thurmond Butts (2005) - MIL, and 4 children   Work: Information systems manager for a comp in [place]: 4 children - adopted Raelyn (2018), Rider (2014), Rylie (2011), Josie (foster daughter 2009)      Enjoys: spend time with family, basketball, swimming - membership to a pool      Exercise: not currently,  just family exercise   Diet: optivia program      Safety   Seat belts: Yes    Guns: Yes  and secure   Safe in relationships: Yes    Social Determinants of Health   Financial Resource Strain: Not on file  Food Insecurity: Not on file  Transportation Needs: Not on file  Physical Activity: Not on file  Stress: Not on file  Social Connections: Not on file  Intimate Partner Violence: Not on file    Family History  Problem Relation Age of Onset   Hypertension Mother    Diabetes Mother    Arthritis Mother    Arthritis Father    Hypertension Maternal Grandmother     Review of Systems  Constitutional:  Negative for chills and fever.       Tired  HENT:  Negative for congestion and sore throat.   Eyes:  Negative for blurred vision and double vision.  Respiratory:  Negative for shortness of breath.   Cardiovascular:  Negative for chest pain.  Gastrointestinal:  Negative for heartburn, nausea and vomiting.  Genitourinary: Negative.   Musculoskeletal: Negative.  Negative for myalgias.  Skin:  Negative for rash.  Neurological:  Negative for dizziness and headaches.  Endo/Heme/Allergies:  Does not bruise/bleed easily.   Psychiatric/Behavioral:  Negative for depression. The patient is not nervous/anxious.     Physical Exam BP (!) 162/110   Pulse 72   Temp (!) 96.2 F (35.7 C) (Temporal)   Ht 5' 6.5" (1.689 m)   Wt 269 lb 2 oz (122.1 kg)   LMP 08/04/2014   SpO2 98%   BMI 42.79 kg/m    BP Readings from Last 3 Encounters:  07/06/21 (!) 162/110  10/06/20 (!) 144/84  06/22/20 (!) 150/100      Physical Exam Constitutional:      General: She is not in acute distress.    Appearance: She is well-developed. She is obese. She is not diaphoretic.  HENT:     Head: Normocephalic and atraumatic.     Right Ear: External ear normal.     Left Ear: External ear normal.     Nose: Nose normal.  Eyes:     General: No scleral icterus.    Extraocular Movements: Extraocular movements intact.     Conjunctiva/sclera: Conjunctivae normal.  Cardiovascular:     Rate and Rhythm: Normal rate and regular rhythm.     Heart sounds: No murmur heard. Pulmonary:     Effort: Pulmonary effort is normal. No respiratory distress.     Breath sounds: Normal breath sounds. No wheezing.  Abdominal:     General: Bowel sounds are normal. There is no distension.     Palpations: Abdomen is soft. There is no mass.     Tenderness: There is no abdominal tenderness. There is no guarding or rebound.  Musculoskeletal:        General: Normal range of motion.     Cervical back: Neck supple.  Lymphadenopathy:     Cervical: No cervical adenopathy.  Skin:    General: Skin is warm and dry.     Capillary Refill: Capillary refill takes less than 2 seconds.  Neurological:     Mental Status: She is alert and oriented to person, place, and time.     Deep Tendon Reflexes: Reflexes normal.  Psychiatric:        Mood and Affect: Mood normal.        Behavior: Behavior normal.    Results:  PHQ-9:  [place] Office Visit from 07/06/2021 in [place] at [place]  PHQ-9 Total Score 5         Assessment: 55 y.o. [phone]  female here for routine annual physical examination.  Plan: Problem List Items Addressed This Visit       Cardiovascular and Mediastinum   HTN (hypertension)   Relevant Medications   lisinopril (ZESTRIL) 10 MG tablet     Respiratory   OSA (obstructive sleep apnea)     Other   Mixed hyperlipidemia   Relevant Medications   lisinopril (ZESTRIL) 10 MG tablet   Other Relevant Orders   Lipid panel   Morbid obesity (HCC)   Relevant Medications   phentermine (ADIPEX-P) 37.5 MG tablet   Other Relevant Orders   Comprehensive metabolic panel   Hemoglobin A1c   Other Visit Diagnoses     Annual physical exam    -  Primary   Essential hypertension       Relevant Medications   lisinopril (ZESTRIL) 10 MG tablet   Other Relevant Orders   Comprehensive metabolic panel   Lipid panel   Need for influenza vaccination       Relevant Orders   Flu Vaccine QUAD 63moIM (Fluarix, Fluzone & Alfiuria Quad PF) (Completed)       Screening:  -- Blood pressure screen  restart lisinopril, return 4 weeks -- cholesterol screening: will obtain -- Weight screening: obese: discussed management options, including lifestyle, dietary, and exercise. -- Diabetes Screening: will obtain -- Nutrition: Encouraged healthy diet  The 10-year ASCVD risk score (Arnett DK, et al., 2019) is: 2.9%   Values used to calculate the score:     Age: [age]years     Sex: Female     Is Non-Hispanic African American: No     Diabetic: No     Tobacco smoker: No     Systolic Blood Pressure: 1720mmHg     Is BP treated: Yes     HDL Cholesterol: 49 mg/dL     Total Cholesterol: 140 mg/dL  -- Statin therapy for Age 55-75with CVD risk >7.5%  Psych -- Depression screening (PHQ-9):  [place] Total Score 5        Safety -- tobacco screening: not using -- alcohol screening:  low-risk usage. -- no evidence of domestic violence or intimate  partner violence.   Cancer Screening -- pap smear not collected per ASCCP guidelines -- family history of breast cancer screening: done. not at high risk. -- Mammogram -  pt will schedule -- Colon cancer (age 55+--  up to date  Immunizations Immunization History  Administered Date(s) Administered   Influenza, Quadrivalent, Recombinant, Inj, Pf 07/16/2017   Influenza,inj,Quad PF,6+ Mos 06/09/2015, 06/18/2019, 06/22/2020, 07/06/2021   Influenza-Unspecified 07/31/2014, 07/03/2018   PFIZER(Purple Top)SARS-COV-2 Vaccination 12/25/2019, 01/19/2020   Tdap 11/22/2017    -- flu vaccine up to date -- TDAP q10 years up to date -- Shingles (age >>15 not up to date - will get a later date -- Covid-19 Vaccine not up to date - encouraged getting   Encouraged healthy diet and exercise. Encouraged regular vision and dental care.    JLesleigh Noe MD

## 2021-07-07 ENCOUNTER — Encounter: Payer: Self-pay | Admitting: Family Medicine

## 2021-07-07 DIAGNOSIS — I10 Essential (primary) hypertension: Secondary | ICD-10-CM

## 2021-07-08 NOTE — Telephone Encounter (Signed)
I was unable to reach pt at any contact # and left v/m requesting pt to call [facility] at [phone]. Sending note to [place] triage and [facility] CMA.

## 2021-07-11 MED ORDER — AMLODIPINE BESYLATE 5 MG PO TABS: 5.0000 mg | ORAL_TABLET | Freq: Every day | ORAL | 0 refills | Status: DC

## 2021-08-03 ENCOUNTER — Other Ambulatory Visit: Payer: Self-pay

## 2021-08-03 ENCOUNTER — Ambulatory Visit: Payer: 59 | Admitting: Family Medicine

## 2021-08-03 DIAGNOSIS — I10 Essential (primary) hypertension: Secondary | ICD-10-CM

## 2021-08-03 MED ORDER — LISINOPRIL 20 MG PO TABS: 20.0000 mg | ORAL_TABLET | Freq: Every day | ORAL | 3 refills | Status: DC

## 2021-08-03 MED ORDER — AMLODIPINE BESYLATE 5 MG PO TABS: 5.0000 mg | ORAL_TABLET | Freq: Every day | ORAL | 3 refills | Status: DC

## 2021-08-03 NOTE — Assessment & Plan Note (Signed)
Controlled. Cont lisinopril 20 mg and amlodipine 5 mg. Labs up to date. Return 1 year or sooner if changes

## 2021-08-03 NOTE — Patient Instructions (Signed)
Continue taking lisinopril 20 mg and Amlodipine 5 mg  Continue to work on healthy weight loss and diet

## 2021-08-03 NOTE — Assessment & Plan Note (Signed)
Working on weight loss. Restarted optivia diet plan with weight down 3 lbs in the last month. Encouraged continued healthy diet/exercise. Return in 6 months if BP improves with weight loss or if difficulty losing and wanting to try medication

## 2021-08-03 NOTE — Progress Notes (Signed)
Subjective:     Jill Cruz is a 55 y.o. female presenting for Follow-up (4 weeks- BP /Running in the low 130's over 80's/Mammogram scheduled for tomorrow)     HPI  #HTN - taking lisinopril 20 mg and amlodipine 5 mg - no HA, cp, sob  -  #obesity - started optivia weight loss program - working on healthy eating -   Review of Systems  07/06/2021: Clinic - HTN - 162/110 - restart lisinopril 07/11/2021: Phone - increase lisinopril 20 mg. add amlodipine 5 mg 07/20/2021: mychart - improvement. but occasionally high - increase to 10 mg amlodipine  Social History   Tobacco Use  Smoking Status Never  Smokeless Tobacco Never        Objective:    BP Readings from Last 3 Encounters:  08/03/21 130/82  07/06/21 (!) 162/110  10/06/20 (!) 144/84   Wt Readings from Last 3 Encounters:  08/03/21 266 lb 8 oz (120.9 kg)  07/06/21 269 lb 2 oz (122.1 kg)  10/06/20 268 lb (121.6 kg)    BP 130/82   Pulse 73   Temp (!) 96.5 F (35.8 C) (Temporal)   Ht 5' 6.5" (1.689 m)   Wt 266 lb 8 oz (120.9 kg)   LMP 08/04/2014   SpO2 97%   BMI 42.37 kg/m    Physical Exam Constitutional:      General: She is not in acute distress.    Appearance: She is well-developed. She is not diaphoretic.  HENT:     Right Ear: External ear normal.     Left Ear: External ear normal.  Eyes:     Conjunctiva/sclera: Conjunctivae normal.  Cardiovascular:     Rate and Rhythm: Normal rate and regular rhythm.     Heart sounds: No murmur heard. Pulmonary:     Effort: Pulmonary effort is normal. No respiratory distress.     Breath sounds: Normal breath sounds. No wheezing.  Musculoskeletal:     Cervical back: Neck supple.  Skin:    General: Skin is warm and dry.     Capillary Refill: Capillary refill takes less than 2 seconds.  Neurological:     Mental Status: She is alert. Mental status is at baseline.  Psychiatric:        Mood and Affect: Mood normal.        Behavior: Behavior normal.           Assessment & Plan:   Problem List Items Addressed This Visit       Cardiovascular and Mediastinum   HTN (hypertension)    Controlled. Cont lisinopril 20 mg and amlodipine 5 mg. Labs up to date. Return 1 year or sooner if changes      Relevant Medications   lisinopril (ZESTRIL) 20 MG tablet   amLODipine (NORVASC) 5 MG tablet     Other   Morbid obesity ([place]) - Primary    Working on weight loss. Restarted optivia diet plan with weight down 3 lbs in the last month. Encouraged continued healthy diet/exercise. Return in 6 months if BP improves with weight loss or if difficulty losing and wanting to try medication      Other Visit Diagnoses     Essential hypertension       Relevant Medications   lisinopril (ZESTRIL) 20 MG tablet   amLODipine (NORVASC) 5 MG tablet        Return in about 1 year (around 08/03/2022) for annual.  Jill Noe, MD  This visit occurred during  the SARS-CoV-2 public health emergency.  Safety protocols were in place, including screening questions prior to the visit, additional usage of staff PPE, and extensive cleaning of exam room while observing appropriate contact time as indicated for disinfecting solutions.

## 2021-08-04 LAB — HM MAMMOGRAPHY

## 2021-08-11 ENCOUNTER — Encounter: Payer: Self-pay | Admitting: Family Medicine

## 2022-06-27 LAB — HM PAP SMEAR: HPV, high-risk: NEGATIVE

## 2022-09-03 ENCOUNTER — Other Ambulatory Visit: Payer: Self-pay

## 2022-09-03 DIAGNOSIS — I10 Essential (primary) hypertension: Secondary | ICD-10-CM

## 2022-09-03 MED ORDER — AMLODIPINE BESYLATE 5 MG PO TABS: 5.0000 mg | ORAL_TABLET | Freq: Every day | ORAL | 0 refills | Status: DC

## 2022-09-07 ENCOUNTER — Other Ambulatory Visit: Payer: Self-pay

## 2022-09-07 DIAGNOSIS — I10 Essential (primary) hypertension: Secondary | ICD-10-CM

## 2022-09-07 MED ORDER — LISINOPRIL 20 MG PO TABS: 20.0000 mg | ORAL_TABLET | Freq: Every day | ORAL | 3 refills | Status: DC

## 2022-09-07 NOTE — Telephone Encounter (Signed)
[place] Night - Client Nonclinical Telephone Record  [facility] Client [facility] Night - Client Client Site [place] - Night Provider Waunita Schooner- MD Contact Type Call Who Is Calling Patient / Member / Family / Caregiver Caller Name Tinleigh Whitmire Caller Phone Number [phone] Patient Name Jill Cruz Patient DOB 09-28-1966 Call Type Message Only Information Provided Reason for Call Request for General Office Information Initial Comment [name] needs a refill on the Rx Lisinopril but her doctor is no longer at the office. Additional Comment Office hours provided. Disp. Time Disposition Final User 09/06/2022 6:26:12 PM General Information Provided Yes Valentino Nose Call Closed By: Valentino Nose Transaction Date/Time: 09/06/2022 6:22:32 PM (ET   Name of Medication: lisinopril 20 mg Name of Pharmacy: [facility] or Written Date and Quantity: #90 x 3 on 08/03/2021 Last Office Visit and Type: 08/03/2021 FU Next Office Visit and Type: TOC with T Dugal FNP 09/19/2022.  Sending request to Dutch Quint FNP.

## 2022-09-12 ENCOUNTER — Other Ambulatory Visit: Payer: Self-pay | Admitting: Family Medicine

## 2022-09-12 DIAGNOSIS — I10 Essential (primary) hypertension: Secondary | ICD-10-CM

## 2022-09-12 NOTE — Telephone Encounter (Signed)
Called pt to reschedule TOC with Dugal. Pt asked could her bp meds, amLODipine (NORVASC) 5 MG table & lisinopril (ZESTRIL) 20 MG tablet, still be refilled since her appt is being pushed back? TOC rescheduled to 12/01/22. [facility]erred [id number] [place] # [phone]

## 2022-09-12 NOTE — Telephone Encounter (Signed)
Patient will not be seen until 3/1 ok to call in 90 day of each ?

## 2022-09-12 NOTE — Addendum Note (Signed)
Addended by: Francella Solian on: 09/12/2022 02:26 PM   Modules accepted: Orders

## 2022-09-13 MED ORDER — AMLODIPINE BESYLATE 5 MG PO TABS: 5.0000 mg | ORAL_TABLET | Freq: Every day | ORAL | 0 refills | Status: DC

## 2022-09-13 MED ORDER — LISINOPRIL 20 MG PO TABS: 20.0000 mg | ORAL_TABLET | Freq: Every day | ORAL | 0 refills | Status: DC

## 2022-09-18 LAB — HM MAMMOGRAPHY

## 2022-09-19 ENCOUNTER — Encounter: Payer: 59 | Admitting: Family

## 2022-09-26 ENCOUNTER — Encounter: Payer: Self-pay | Admitting: Family Medicine

## 2022-12-01 ENCOUNTER — Encounter: Payer: 59 | Admitting: Family

## 2023-01-09 ENCOUNTER — Encounter: Payer: Self-pay | Admitting: Family

## 2023-01-09 ENCOUNTER — Ambulatory Visit: Payer: 59 | Admitting: Family

## 2023-01-09 VITALS — BP 122/76 | HR 70 | Temp 97.9°F | Ht 66.5 in | Wt 274.6 lb

## 2023-01-09 DIAGNOSIS — I10 Essential (primary) hypertension: Secondary | ICD-10-CM

## 2023-01-09 DIAGNOSIS — G8929 Other chronic pain: Secondary | ICD-10-CM

## 2023-01-09 DIAGNOSIS — M25561 Pain in right knee: Secondary | ICD-10-CM

## 2023-01-09 DIAGNOSIS — R600 Localized edema: Secondary | ICD-10-CM | POA: Diagnosis not present

## 2023-01-09 DIAGNOSIS — G4733 Obstructive sleep apnea (adult) (pediatric): Secondary | ICD-10-CM

## 2023-01-09 DIAGNOSIS — E782 Mixed hyperlipidemia: Secondary | ICD-10-CM

## 2023-01-09 MED ORDER — HYDROCHLOROTHIAZIDE 25 MG PO TABS: ORAL_TABLET | ORAL | 0 refills | Status: DC

## 2023-01-09 NOTE — Assessment & Plan Note (Signed)
Continue cpap  Stable

## 2023-01-09 NOTE — Progress Notes (Signed)
Established Patient Office Visit  Subjective:  Patient ID: Jill GillsChristine M Cruz, female    DOB: [birth year]  Age: 57 y.o. MRN: [id number]  CC:  Chief Complaint  Patient presents with   Establish Care    TOC from Dr Selena Battenody    HPI Jill Cruz is here for a transition of care visit.  Prior provider was:Dr. Gweneth DimitriJessica Cody   Pt is with acute concerns.  Bil feet and ankle swelling.  At the end of the day notices this more so than she has before.  Does sit often at work more so than standing.  Doesn't eat much salt in the diet.  Denies sob or doe.  Does try to elevate legs at end of the day which helps Doesn't wear compression stockings.  Does have achilles in bil lower ext does see orthopedist for this.    chronic concerns:  Obesity: currently seeing [facility] for weight management, recently restarted phentermine she is taking half tablet of phentermine 37.5 mg once daily currently, started about two weeks ago.   HTN: taking amlodipine and lisinopril for this. Stable today with 122/76.   Csection scar at times will get irritated, uses nystatin triam for this prn.   Arthritis, right knee: on melodic 7.5 mg once daily.   Menopause: last period over one year ago.  Pmp about seven years ago.   Vitamin d def: was tested about one month ago, low vitamin D.   Past Medical History:  Diagnosis Date   Anxiety 2011   Post Partum Anxiety/Depression treated w/ medication. Now resolved.   Family history of adverse reaction to anesthesia    Maternal aunt - PONV   GERD (gastroesophageal reflux disease)    Mild baseline- worse w/pregnancy. Takes TUMS prn.   History of pre-eclampsia in prior pregnancy, currently pregnant    Hypertension    On Labetalol   Motion sickness    cars, boats   MTHFR mutation 03/27/2013   PONV (postoperative nausea and vomiting)    OK after colonoscopy with propofol   Postpartum care following cesarean delivery (03/27/13) 03/27/2013   S/P repeat  low transverse C-section 03/30/2013    Past Surgical History:  Procedure Laterality Date   [facility]   CESAREAN SECTION N/A 03/27/2013   Procedure: REPEAT CESAREAN SECTION;  Surgeon: Tresa EndoKelly A. Ernestina PennaFogleman, MD;  Location: [facility] ORS;  Service: Obstetrics;  Laterality: N/A;  Repeat C/S  EDD: 04/23/13   [facility]  [facility] [place]   COLONOSCOPY WITH PROPOFOL N/A 01/02/2017   Procedure: COLONOSCOPY WITH PROPOFOL;  Surgeon: Midge Miniumarren Wohl, MD;  Location: [facility] ENDOSCOPY;  Service: Endoscopy;  Laterality: N/A;   DIAGNOSTIC LAPAROSCOPY Left ~2008   [facility] [place]   KNEE ARTHROSCOPY W/ ACL RECONSTRUCTION AND PATELLA GRAFT Right 2000   [facility] [place]   KNEE ARTHROSCOPY W/ PARTIAL MEDIAL MENISCECTOMY Right 2000    Family History  Problem Relation Age of Onset   Hypertension Mother    Diabetes Mother    Arthritis Mother    Arthritis Father    Hypertension Maternal Grandmother     Social History   Socioeconomic History   Marital status: Married    Spouse name: Alycia RossettiRyan   Number of children: 3   Years of education: masters   Highest education level: Not on file  Occupational History   Occupation: Building control surveyorlearning and developement, onboarding    Employer: [employer]  Tobacco  Use   Smoking status: Never   Smokeless tobacco: Never  Vaping Use   Vaping Use: Never used  Substance and Sexual Activity   Alcohol use: Never    Alcohol/week: 0.0 standard drinks of alcohol   Drug use: No   Sexual activity: Yes    Partners: Male    Birth control/protection: Post-menopausal  Other Topics Concern   Not on file  Social History Narrative   Married.   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      06/22/20   From: [facility] - met her husband in [place]   Living: with husband, Alycia Rossetti (2005) - MIL, and 4 children   Work: Teacher, music for a comp in [employer]      Family: 4 children - adopted Raelyn (2018), Rider (2014), Rylie  (2011)      Enjoys: spend time with family, basketball, swimming - membership to a pool      Exercise: not currently, just family exercise   Diet: optivia Diplomatic Services operational officer belts: Yes    Guns: Yes  and secure   Safe in relationships: Yes    Social Determinants of Corporate investment banker Strain: Not on file  Food Insecurity: Not on file  Transportation Needs: Not on file  Physical Activity: Not on file  Stress: Not on file  Social Connections: Not on file  Intimate Partner Violence: Not on file    Outpatient Medications Prior to Visit  Medication Sig Dispense Refill   amLODipine (NORVASC) 5 MG tablet Take 1 tablet (5 mg total) by mouth daily. Will need to be seen in office for more refills. 90 tablet 0   lisinopril (ZESTRIL) 20 MG tablet Take 1 tablet (20 mg total) by mouth daily. TAKE 1 TABLET BY MOUTH EVERY DAY 90 tablet 0   meloxicam (MOBIC) 7.5 MG tablet Take 7.5 mg by mouth as needed.     nystatin-triamcinolone (MYCOLOG II) cream Apply 1 application topically as needed.      phentermine (ADIPEX-P) 37.5 MG tablet Take 37.5 mg by mouth daily. Take 1/2 tablet once daily     fluticasone (FLONASE) 50 MCG/ACT nasal spray Place 2 sprays into both nostrils daily. (Patient taking differently: Place 2 sprays into both nostrils as needed.) 48 g 0   No facility-administered medications prior to visit.    Allergies  Allergen Reactions   Codeine Nausea And Vomiting    ROS: Pertinent symptoms negative unless otherwise noted in HPI      Objective:    Physical Exam Vitals reviewed.  Constitutional:      Appearance: Normal appearance. She is obese.  Eyes:     General:        Right eye: No discharge.        Left eye: No discharge.     Conjunctiva/sclera: Conjunctivae normal.  Cardiovascular:     Rate and Rhythm: Normal rate and regular rhythm.  Pulmonary:     Effort: Pulmonary effort is normal. No respiratory distress.     Breath sounds: Normal breath  sounds.  Musculoskeletal:        General: Normal range of motion.     Cervical back: Normal range of motion.     Right lower leg: 1+ Edema present.     Left lower leg: 1+ Pitting Edema present.  Neurological:     General: No focal deficit present.     Mental Status: She is alert and oriented to  person, place, and time. Mental status is at baseline.  Psychiatric:        Mood and Affect: Mood normal.        Behavior: Behavior normal.        Thought Content: Thought content normal.        Judgment: Judgment normal.      BP 122/76 (BP Location: Left Arm)   Pulse 70   Temp 97.9 F (36.6 C) (Temporal)   Ht 5' 6.5" (1.689 m)   Wt 274 lb 9.6 oz (124.6 kg)   LMP 08/02/2014   SpO2 99%   BMI 43.66 kg/m  Wt Readings from Last 3 Encounters:  01/09/23 274 lb 9.6 oz (124.6 kg)  08/03/21 266 lb 8 oz (120.9 kg)  07/06/21 269 lb 2 oz (122.1 kg)     There are no preventive care reminders to display for this patient.  There are no preventive care reminders to display for this patient.  Lab Results  Component Value Date   TSH 1.43 06/18/2019   Lab Results  Component Value Date   WBC 7.6 06/22/2020   HGB 13.7 06/22/2020   HCT 40.5 06/22/2020   MCV 86.9 06/22/2020   PLT 293.0 06/22/2020   Lab Results  Component Value Date   NA 140 07/06/2021   K 4.3 07/06/2021   CO2 28 07/06/2021   GLUCOSE 77 07/06/2021   BUN 11 07/06/2021   CREATININE 0.75 07/06/2021   BILITOT 0.4 07/06/2021   ALKPHOS 75 07/06/2021   AST 14 07/06/2021   ALT 24 07/06/2021   PROT 6.8 07/06/2021   ALBUMIN 4.1 07/06/2021   CALCIUM 9.7 07/06/2021   ANIONGAP 9 04/07/2015   GFR 89.97 07/06/2021   Lab Results  Component Value Date   CHOL 145 07/06/2021   Lab Results  Component Value Date   HDL 47.00 07/06/2021   Lab Results  Component Value Date   LDLCALC 59 07/06/2021   Lab Results  Component Value Date   TRIG 193.0 (H) 07/06/2021   Lab Results  Component Value Date   CHOLHDL 3 07/06/2021    Lab Results  Component Value Date   HGBA1C 5.7 07/06/2021      Assessment & Plan:   OSA (obstructive sleep apnea) Assessment & Plan: Continue cpap  Stable    Chronic pain of right knee  Primary hypertension Assessment & Plan: Urine microalbumin ordered pending results.   Orders: -     Microalbumin / creatinine urine ratio  Pedal edema Assessment & Plan: Newer onset Less likely chf due to improvement with elevation and asymptomatic otherwise with doe/sob Advised to wear daily compression stockings, elevate legs at night, watch sodium intake and take hctz 12.5 mg once daily prn pedal edema.  Did advise to try to get up about every hour at work to walk around.  Orders: -     hydroCHLOROthiazide; Take 1/2 tablet once daily prn pedal edema  Dispense: 30 tablet; Refill: 0  Mixed hyperlipidemia Assessment & Plan: Will await lab work from pt from most recent lab draw at weight loss clinic.     Meds ordered this encounter  Medications   hydrochlorothiazide (HYDRODIURIL) 25 MG tablet    Sig: Take 1/2 tablet once daily prn pedal edema    Dispense:  30 tablet    Refill:  0    Order Specific Question:   Supervising Provider    Answer:   BEDSOLE, AMY E [[id number]]    Follow-up: Return in about 1 year (  around 01/09/2024) for f/u blood pressure, f/u CPE.    Mort Sawyers, FNP

## 2023-01-09 NOTE — Assessment & Plan Note (Signed)
Will await lab work from pt from most recent lab draw at weight loss clinic.

## 2023-01-09 NOTE — Assessment & Plan Note (Signed)
Newer onset Less likely chf due to improvement with elevation and asymptomatic otherwise with doe/sob Advised to wear daily compression stockings, elevate legs at night, watch sodium intake and take hctz 12.5 mg once daily prn pedal edema.  Did advise to try to get up about every hour at work to walk around.

## 2023-01-09 NOTE — Assessment & Plan Note (Signed)
Urine microalbumin ordered pending results.   

## 2023-01-10 NOTE — Addendum Note (Signed)
Addended by: Alvina Chou on: 01/10/2023 10:29 AM   Modules accepted: Orders

## 2023-01-23 ENCOUNTER — Other Ambulatory Visit: Payer: Self-pay | Admitting: Family

## 2023-01-23 DIAGNOSIS — R600 Localized edema: Secondary | ICD-10-CM

## 2023-03-04 ENCOUNTER — Other Ambulatory Visit: Payer: Self-pay | Admitting: Family

## 2023-03-04 DIAGNOSIS — I10 Essential (primary) hypertension: Secondary | ICD-10-CM

## 2023-03-15 ENCOUNTER — Encounter: Payer: Self-pay | Admitting: Family

## 2023-03-15 DIAGNOSIS — I10 Essential (primary) hypertension: Secondary | ICD-10-CM

## 2023-03-15 MED ORDER — LISINOPRIL 20 MG PO TABS: 20.0000 mg | ORAL_TABLET | Freq: Every day | ORAL | 0 refills | Status: DC

## 2023-03-15 NOTE — Addendum Note (Signed)
Addended by: Mort Sawyers on: 03/15/2023 03:10 PM   Modules accepted: Orders

## 2023-03-15 NOTE — Telephone Encounter (Signed)
Patient has appointment 6/18 for [facility]

## 2023-03-20 ENCOUNTER — Ambulatory Visit: Payer: 59 | Admitting: Family

## 2023-03-20 VITALS — BP 134/76 | HR 74 | Temp 97.6°F | Ht 66.5 in | Wt 273.0 lb

## 2023-03-20 DIAGNOSIS — I10 Essential (primary) hypertension: Secondary | ICD-10-CM | POA: Diagnosis not present

## 2023-03-20 DIAGNOSIS — M2550 Pain in unspecified joint: Secondary | ICD-10-CM | POA: Diagnosis not present

## 2023-03-20 DIAGNOSIS — R600 Localized edema: Secondary | ICD-10-CM

## 2023-03-20 DIAGNOSIS — M2560 Stiffness of unspecified joint, not elsewhere classified: Secondary | ICD-10-CM | POA: Diagnosis not present

## 2023-03-20 DIAGNOSIS — R5383 Other fatigue: Secondary | ICD-10-CM

## 2023-03-20 DIAGNOSIS — R7303 Prediabetes: Secondary | ICD-10-CM | POA: Diagnosis not present

## 2023-03-20 DIAGNOSIS — E559 Vitamin D deficiency, unspecified: Secondary | ICD-10-CM

## 2023-03-20 DIAGNOSIS — G4733 Obstructive sleep apnea (adult) (pediatric): Secondary | ICD-10-CM

## 2023-03-20 DIAGNOSIS — E782 Mixed hyperlipidemia: Secondary | ICD-10-CM

## 2023-03-20 LAB — BASIC METABOLIC PANEL
BUN: 15 mg/dL (ref 6–23)
CO2: 28 mEq/L (ref 19–32)
Calcium: 9.6 mg/dL (ref 8.4–10.5)
Chloride: 103 mEq/L (ref 96–112)
Creatinine, Ser: 0.7 mg/dL (ref 0.40–1.20)
GFR: 96.58 mL/min (ref >60.00)
Glucose, Bld: 86 mg/dL (ref 70–99)
Potassium: 4.4 mEq/L (ref 3.5–5.1)
Sodium: 137 mEq/L (ref 135–145)

## 2023-03-20 LAB — CBC
HCT: 37.7 % (ref 36.0–46.0)
Hemoglobin: 12.4 g/dL (ref 12.0–15.0)
MCHC: 33 g/dL (ref 30.0–36.0)
MCV: 87.9 fl (ref 78.0–100.0)
Platelets: 391 10*3/uL (ref 150.0–400.0)
RBC: 4.29 Mil/uL (ref 3.87–5.11)
RDW: 13.5 % (ref 11.5–15.5)
WBC: 8.4 10*3/uL (ref 4.0–10.5)

## 2023-03-20 LAB — SEDIMENTATION RATE: Sed Rate: 24 mm/hr (ref 0–30)

## 2023-03-20 LAB — VITAMIN B12: Vitamin B-12: 469 pg/mL (ref 211–911)

## 2023-03-20 LAB — TSH: TSH: 1.35 u[IU]/mL (ref 0.35–5.50)

## 2023-03-20 MED ORDER — LISINOPRIL 20 MG PO TABS: 20.0000 mg | ORAL_TABLET | Freq: Every day | ORAL | 3 refills | Status: DC

## 2023-03-20 NOTE — Assessment & Plan Note (Signed)
Improving  Cont hydrochlorothiazide 25 mg once daily

## 2023-03-20 NOTE — Assessment & Plan Note (Deleted)
Pt advised of the following:  Continue medication as prescribed. Monitor blood pressure periodically and/or when you feel symptomatic. Goal is <130/90 on average. Ensure that you have rested for 30 minutes prior to checking your blood pressure. Record your readings and bring them to your next visit if necessary.work on a low sodium diet. Continue medication as prescribed

## 2023-03-20 NOTE — Assessment & Plan Note (Signed)
Ordered vitamin d pending results.   

## 2023-03-20 NOTE — Assessment & Plan Note (Signed)
Pt advised to work on diet and exercise as tolerated  

## 2023-03-20 NOTE — Assessment & Plan Note (Signed)
Continue cpap.  

## 2023-03-20 NOTE — Patient Instructions (Signed)
  Look into the plant based mediterranean diet.   Stop by the lab prior to leaving today. I will notify you of your results once received.    Regards,   Mort Sawyers FNP-C

## 2023-03-20 NOTE — Assessment & Plan Note (Signed)
Pt advised of the following: Work on a diabetic diet, try to incorporate exercise at least 20-30 a day for 3 days a week or more.   

## 2023-03-20 NOTE — Progress Notes (Signed)
Established Patient Office Visit  Subjective:      CC:  Chief Complaint  Patient presents with   Hypertension    HPI: Jill Cruz is a 57 y.o. female presenting on 03/20/2023 for Hypertension . HTN: on lisinopril 20 mg once daily and amlodipine 5 mg once daily. Also on hydrochlorothiazide 25 mg once daily.   Pedal edema: did increase hydrochlorothiazide to 25 mg last time. Advised pt to wear compression stockings. This has helped her, she only takes prn maybe 3-4 times which has really helped.   HLD:  Lab Results  Component Value Date   CHOL 145 07/06/2021   HDL 47.00 07/06/2021   LDLCALC 59 07/06/2021   TRIG 193.0 (H) 07/06/2021   CHOLHDL 3 07/06/2021    New complaints: Achy joints: on meloxicam 15 mg once daily.  Does have left sided knee pain with arthritis from the past. Has bene recently seen by the orthopedist and states was given some exercises to do and work on exercises. Goes to [facility]. Finds in the am or at night she has a hard time walking and this is due to bil knee stiffness that > 1 hour at times. At times hard to get up stairs 'feels like I'm 57 years old' does feel increased fatigue as well.   Does worry about weight as well, feels like she has done really well and then she will weight herself and be upset and then kind of give up on her diet plan. She does not really exercise often.    Social history:  Relevant past medical, surgical, family and social history reviewed and updated as indicated. Interim medical history since our last visit reviewed.  Allergies and medications reviewed and updated.  DATA REVIEWED: CHART IN EPIC     ROS: Negative unless specifically indicated above in HPI.    Current Outpatient Medications:    amLODipine (NORVASC) 5 MG tablet, Take 1 tablet (5 mg total) by mouth daily. Will need to be seen in office for more refills., Disp: 90 tablet, Rfl: 0   folic acid (FOLVITE) 1 MG tablet, Take 1 mg by mouth  daily., Disp: , Rfl:    hydrochlorothiazide (HYDRODIURIL) 25 MG tablet, TAKE 1/2 TABLET ONCE DAILY AS NEEDED PEDAL EDEMA, Disp: 45 tablet, Rfl: 1   meloxicam (MOBIC) 15 MG tablet, Take 15 mg by mouth daily., Disp: , Rfl:    nystatin-triamcinolone (MYCOLOG II) cream, Apply 1 application topically as needed. , Disp: , Rfl:    lisinopril (ZESTRIL) 20 MG tablet, Take 1 tablet (20 mg total) by mouth daily. TAKE 1 TABLET BY MOUTH EVERY DAY, Disp: 90 tablet, Rfl: 3      Objective:    BP 134/76   Pulse 74   Temp 97.6 F (36.4 C) (Temporal)   Ht 5' 6.5" (1.689 m)   Wt 273 lb (123.8 kg)   LMP 08/02/2014   SpO2 97%   BMI 43.40 kg/m   Wt Readings from Last 3 Encounters:  03/20/23 273 lb (123.8 kg)  01/09/23 274 lb 9.6 oz (124.6 kg)  08/03/21 266 lb 8 oz (120.9 kg)    Physical Exam Constitutional:      General: She is not in acute distress.    Appearance: Normal appearance. She is normal weight. She is not ill-appearing, toxic-appearing or diaphoretic.  HENT:     Head: Normocephalic.  Cardiovascular:     Rate and Rhythm: Normal rate and regular rhythm.  Pulmonary:     Effort:  Pulmonary effort is normal.     Breath sounds: Normal breath sounds.  Musculoskeletal:        General: Normal range of motion.  Neurological:     General: No focal deficit present.     Mental Status: She is alert and oriented to person, place, and time. Mental status is at baseline.  Psychiatric:        Mood and Affect: Mood normal.        Behavior: Behavior normal.        Thought Content: Thought content normal.        Judgment: Judgment normal.           Assessment & Plan:  Prediabetes Assessment & Plan: Pt advised of the following: Work on a diabetic diet, try to incorporate exercise at least 20-30 a day for 3 days a week or more.    Orders: -     Basic metabolic panel  Essential hypertension Assessment & Plan: Pt advised of the following:  Continue medication as prescribed. Monitor blood  pressure periodically and/or when you feel symptomatic. Goal is <130/90 on average. Ensure that you have rested for 30 minutes prior to checking your blood pressure. Record your readings and bring them to your next visit if necessary.work on a low sodium diet. Continue medication as prescribed  Orders: -     Lisinopril; Take 1 tablet (20 mg total) by mouth daily. TAKE 1 TABLET BY MOUTH EVERY DAY  Dispense: 90 tablet; Refill: 3 -     TSH -     Basic metabolic panel  Morbid obesity (HCC) Assessment & Plan: Pt advised to work on diet and exercise as tolerated     Vitamin D deficiency Assessment & Plan: Ordered vitamin d pending results.     Mixed hyperlipidemia Assessment & Plan: Ordered lipid panel, pending results. Work on low cholesterol diet and exercise as tolerated    Pedal edema Assessment & Plan: Improving  Cont hydrochlorothiazide 25 mg once daily    Primary hypertension  Pain in joint, multiple sites Assessment & Plan: R/o autoimmune Lab ordered and pending Ddx OA, PSA, RF  Orders: -     ANA -     Rheumatoid factor -     Sedimentation rate -     TSH -     Vitamin B12  Joint stiffness -     Vitamin B12  Other fatigue -     Vitamin B12 -     CBC -     Basic metabolic panel  OSA (obstructive sleep apnea) Assessment & Plan: Continue cpap       Return in about 1 year (around 03/19/2024) for f/u CPE.  Mort Sawyers, MSN, APRN, FNP-C [facility]

## 2023-03-20 NOTE — Assessment & Plan Note (Signed)
R/o autoimmune Lab ordered and pending Ddx OA, PSA, RF

## 2023-03-20 NOTE — Assessment & Plan Note (Signed)
Pt advised of the following:  Continue medication as prescribed. Monitor blood pressure periodically and/or when you feel symptomatic. Goal is <130/90 on average. Ensure that you have rested for 30 minutes prior to checking your blood pressure. Record your readings and bring them to your next visit if necessary.work on a low sodium diet. Continue medication as prescribed 

## 2023-03-20 NOTE — Assessment & Plan Note (Signed)
Ordered lipid panel, pending results. Work on low cholesterol diet and exercise as tolerated  

## 2023-03-21 ENCOUNTER — Encounter: Payer: Self-pay | Admitting: Obstetrics

## 2023-03-21 LAB — RHEUMATOID FACTOR: Rheumatoid fact SerPl-aCnc: <10 IU/mL (ref <14)

## 2023-03-21 LAB — ANA: Anti Nuclear Antibody (ANA): NEGATIVE

## 2023-04-01 ENCOUNTER — Other Ambulatory Visit: Payer: Self-pay | Admitting: Family

## 2023-04-01 DIAGNOSIS — I10 Essential (primary) hypertension: Secondary | ICD-10-CM

## 2023-04-12 ENCOUNTER — Other Ambulatory Visit: Payer: Self-pay

## 2023-04-12 ENCOUNTER — Encounter: Payer: Self-pay | Admitting: Family

## 2023-04-12 DIAGNOSIS — I10 Essential (primary) hypertension: Secondary | ICD-10-CM

## 2023-04-12 MED ORDER — AMLODIPINE BESYLATE 5 MG PO TABS
5.0000 mg | ORAL_TABLET | Freq: Every day | ORAL | 0 refills | Status: DC
Start: 2023-04-12 — End: 2023-06-26

## 2023-06-26 ENCOUNTER — Ambulatory Visit: Payer: 59 | Admitting: Family

## 2023-06-26 ENCOUNTER — Other Ambulatory Visit: Payer: Self-pay | Admitting: Family

## 2023-06-26 ENCOUNTER — Encounter: Payer: Self-pay | Admitting: Family

## 2023-06-26 VITALS — BP 160/100 | HR 78 | Temp 97.8°F | Ht 67.0 in | Wt 281.2 lb

## 2023-06-26 DIAGNOSIS — F411 Generalized anxiety disorder: Secondary | ICD-10-CM | POA: Insufficient documentation

## 2023-06-26 DIAGNOSIS — R7303 Prediabetes: Secondary | ICD-10-CM

## 2023-06-26 DIAGNOSIS — E782 Mixed hyperlipidemia: Secondary | ICD-10-CM

## 2023-06-26 DIAGNOSIS — G4733 Obstructive sleep apnea (adult) (pediatric): Secondary | ICD-10-CM

## 2023-06-26 DIAGNOSIS — Z9189 Other specified personal risk factors, not elsewhere classified: Secondary | ICD-10-CM

## 2023-06-26 DIAGNOSIS — I10 Essential (primary) hypertension: Secondary | ICD-10-CM | POA: Diagnosis not present

## 2023-06-26 MED ORDER — WEGOVY 0.25 MG/0.5ML ~~LOC~~ SOAJ: SUBCUTANEOUS | 0 refills | Status: DC

## 2023-06-26 MED ORDER — SERTRALINE HCL 50 MG PO TABS
50.0000 mg | ORAL_TABLET | Freq: Every day | ORAL | 0 refills | Status: DC
Start: 2023-06-26 — End: 2023-09-19

## 2023-06-26 MED ORDER — WEGOVY 0.5 MG/0.5ML ~~LOC~~ SOAJ
SUBCUTANEOUS | 2 refills | Status: DC
Start: 2023-06-26 — End: 2023-06-28

## 2023-06-26 MED ORDER — AMLODIPINE BESYLATE 10 MG PO TABS
10.0000 mg | ORAL_TABLET | Freq: Every day | ORAL | 0 refills | Status: DC
Start: 2023-06-26 — End: 2023-09-20

## 2023-06-26 NOTE — Patient Instructions (Addendum)
  Increase amlodipine 10 mg once daily and monitor blood pressure at home.    ------------------------------------  Start sertraline 50 mg for anxiety. Take 1/2 tablet by mouth once daily for about one week, then increase to 1 full tablet thereafter.   Taking the medicine as directed and not missing any doses is one of the best things you can do to treat your anxiety.  Here are some things to keep in mind:  Side effects (stomach upset, some increased anxiety) may happen before you notice a benefit.  These side effects typically go away over time. Changes to your dose of medicine or a change in medication all together is sometimes necessary Many people will notice an improvement within two weeks but the full effect of the medication can take up to 4-6 weeks Stopping the medication when you start feeling better often results in a return of symptoms. Most people need to be on medication at least 6-12 months If you start having thoughts of hurting yourself or others after starting this medicine, please call me immediately.    ------------------------------------  I sent in a prescription for a weight loss medication called Wegovy.  If approved you can start this as prescribed. Sometimes we need to complete a prior auth prior to approval.   If not approved,  I will have to refer you to our weight loss clinic where they might have better approval odds, and can help you on your weight loss journey.   If approved, you will start 0.25 mg once weekly for four weeks, then increase to 0.5 mg once weekly if tolerating well after four weeks. Be sure to make a follow up appointment in three months after starting the medication so we can document weight loss, and monitor how you are doing on the medication.   Bring with you your new diet plan and weight weigh ins at least once weekly to document your journey.   Some apps to look at our myfitnesspal and or the [employer] app.  Also look at freshmealplan.com if  you prefer pre made meals that are delivered to your door step

## 2023-06-26 NOTE — Assessment & Plan Note (Signed)
I instructed pt to start sertraline 1/2 tablet once daily for 1 week and then increase to a full tablet once daily on week two as tolerated.  We discussed common side effects such as nausea, drowsiness and weight gain.  Also discussed rare but serious side effect of suicidal ideation.  She is instructed to discontinue medication and go directly to ED if this occurs.  Pt verbalizes understanding.  Plan is to follow up in 30 days to evaluate progress.

## 2023-06-26 NOTE — Assessment & Plan Note (Signed)
Compliant with c-pap

## 2023-06-26 NOTE — Assessment & Plan Note (Signed)
Pt advised of the following: Work on a diabetic diet, try to incorporate exercise at least 20-30 a day for 3 days a week or more.   

## 2023-06-26 NOTE — Assessment & Plan Note (Signed)
Long d/w pt on weight loss medication options as well as a plan for exercising and diet changes, and goals that we can implement to meet desired weight loss goals. Pt advised to start a healthy diet and exercise program alongside taking this medication. Printout given for healthy eating and exercise recommendations.   Did advise pt that I will prescribe wegovy. If this is not approved pt was advised I will have to refer her to weight loss clinic where they may be able to provide alternative options.   Discussed with patient various weight loss medications we could try to apply for with insurance.  Also advised her this may not be approved, and we can try for prior auth and it still may not be approved.  If not approved, we will need to refer to weight loss medical clinic.  If approved, advised pt to f/u within three months after starting to document weight loss and also document how pt is tolerating medication.

## 2023-06-26 NOTE — Assessment & Plan Note (Signed)
Increase amlodipine to 10 mg once daily  Continue lisinopril 20 mg once daily  Pt advised of the following:  Continue medication as prescribed. Monitor blood pressure periodically and/or when you feel symptomatic. Goal is <130/90 on average. Ensure that you have rested for 30 minutes prior to checking your blood pressure. Record your readings and bring them to your next visit if necessary.work on a low sodium diet.

## 2023-06-26 NOTE — Progress Notes (Signed)
Established Patient Office Visit  Subjective:      CC:  Chief Complaint  Patient presents with   Medical Management of Chronic Issues    HPI: Jill Cruz is a 57 y.o. female presenting on 06/26/2023 for Medical Management of Chronic Issues . HTN: pt has been noticing blood pressure running high, she was at doc last week and was 179/92. She does not check her blood pressure at home. Denies CP or sob. No headaches. Slight blurry vision over the last two weeks. She is on computer often so thought that might be contributing, eye exams up to date, July was last visit.   Also with increased anxiety, daily feeling of butterflies in her chest. She does report a lot of stressors at current. Six year old adopted child at home, having a hard time with her behaviorally. She is on medication for her ADD but at current it is hard in am and coming home from school that she acts out more, and it is becoming more and more intense. She has been given a recommendation for a therapist that she plans to get set up with. She was on medication about 13 years ago, for Post partum depression. She thinks it was sertraline and had no issues. She is open to medication at this time.   New complaints: Anxiety also increased over her weight, unable to lose weight. Increased stressors, often exhausted.   Diet: started weight watchers, does well for some time but then goes off of the diet. She stress eats as well. Hard to maintain appetite surges, and other times doesn't have time for breakfast and will grab whatever.  Exercise: she is active with her kids but no regular scheduled exercise. Hard to push herself due to often with joint pain.       Social history:  Relevant past medical, surgical, family and social history reviewed and updated as indicated. Interim medical history since our last visit reviewed.  Allergies and medications reviewed and updated.  DATA REVIEWED: CHART IN EPIC     ROS:  Negative unless specifically indicated above in HPI.    Current Outpatient Medications:    amLODipine (NORVASC) 10 MG tablet, Take 1 tablet (10 mg total) by mouth daily., Disp: 90 tablet, Rfl: 0   folic acid (FOLVITE) 1 MG tablet, Take 1 mg by mouth daily., Disp: , Rfl:    hydrochlorothiazide (HYDRODIURIL) 25 MG tablet, TAKE 1/2 TABLET ONCE DAILY AS NEEDED PEDAL EDEMA, Disp: 45 tablet, Rfl: 1   lisinopril (ZESTRIL) 20 MG tablet, Take 1 tablet (20 mg total) by mouth daily. TAKE 1 TABLET BY MOUTH EVERY DAY, Disp: 90 tablet, Rfl: 3   meloxicam (MOBIC) 15 MG tablet, Take 15 mg by mouth daily., Disp: , Rfl:    nystatin-triamcinolone (MYCOLOG II) cream, Apply 1 application topically as needed. , Disp: , Rfl:    Semaglutide-Weight Management (WEGOVY) 0.25 MG/0.5ML SOAJ, Start 0.25 mg qweek for four weeks, then increase to 0.5 mg dose qweek, Disp: 2 mL, Rfl: 0   Semaglutide-Weight Management (WEGOVY) 0.5 MG/0.5ML SOAJ, After four weeks of 0.25 mg qweek, increase to 0.5 mg qweek, Disp: 2 mL, Rfl: 2   sertraline (ZOLOFT) 50 MG tablet, Take 1 tablet (50 mg total) by mouth daily., Disp: 90 tablet, Rfl: 0      Objective:    BP (!) 160/100   Pulse 78   Temp 97.8 F (36.6 C) (Temporal)   Ht 5\' 7"  (1.702 m)   Wt 281 lb  3.2 oz (127.6 kg)   LMP 08/02/2014   SpO2 96%   BMI 44.04 kg/m   Wt Readings from Last 3 Encounters:  06/26/23 281 lb 3.2 oz (127.6 kg)  03/20/23 273 lb (123.8 kg)  01/09/23 274 lb 9.6 oz (124.6 kg)    Physical Exam Constitutional:      General: She is not in acute distress.    Appearance: Normal appearance. She is obese. She is not ill-appearing, toxic-appearing or diaphoretic.  HENT:     Head: Normocephalic.  Cardiovascular:     Rate and Rhythm: Normal rate and regular rhythm.  Pulmonary:     Effort: Pulmonary effort is normal.     Breath sounds: Normal breath sounds.  Musculoskeletal:        General: Normal range of motion.  Neurological:     General: No focal  deficit present.     Mental Status: She is alert and oriented to person, place, and time. Mental status is at baseline.  Psychiatric:        Mood and Affect: Mood normal.        Behavior: Behavior normal.        Thought Content: Thought content normal.        Judgment: Judgment normal.           Assessment & Plan:  Essential hypertension Assessment & Plan: Increase amlodipine to 10 mg once daily  Continue lisinopril 20 mg once daily  Pt advised of the following:  Continue medication as prescribed. Monitor blood pressure periodically and/or when you feel symptomatic. Goal is <130/90 on average. Ensure that you have rested for 30 minutes prior to checking your blood pressure. Record your readings and bring them to your next visit if necessary.work on a low sodium diet.    Orders: -     amLODIPine Besylate; Take 1 tablet (10 mg total) by mouth daily.  Dispense: 90 tablet; Refill: 0  GAD (generalized anxiety disorder) Assessment & Plan:  I instructed pt to start sertraline 1/2 tablet once daily for 1 week and then increase to a full tablet once daily on week two as tolerated.  We discussed common side effects such as nausea, drowsiness and weight gain.  Also discussed rare but serious side effect of suicidal ideation.  She is instructed to discontinue medication and go directly to ED if this occurs.  Pt verbalizes understanding.  Plan is to follow up in 30 days to evaluate progress.     Orders: -     Sertraline HCl; Take 1 tablet (50 mg total) by mouth daily.  Dispense: 90 tablet; Refill: 0  Morbid obesity (HCC) Assessment & Plan: Long d/w pt on weight loss medication options as well as a plan for exercising and diet changes, and goals that we can implement to meet desired weight loss goals. Pt advised to start a healthy diet and exercise program alongside taking this medication. Printout given for healthy eating and exercise recommendations.   Did advise pt that I will prescribe  wegovy. If this is not approved pt was advised I will have to refer her to weight loss clinic where they may be able to provide alternative options.   Discussed with patient various weight loss medications we could try to apply for with insurance.  Also advised her this may not be approved, and we can try for prior auth and it still may not be approved.  If not approved, we will need to refer to weight loss medical clinic.  If approved, advised pt to f/u within three months after starting to document weight loss and also document how pt is tolerating medication.     Orders: -     Wegovy; After four weeks of 0.25 mg qweek, increase to 0.5 mg qweek  Dispense: 2 mL; Refill: 2 -     Wegovy; Start 0.25 mg qweek for four weeks, then increase to 0.5 mg dose qweek  Dispense: 2 mL; Refill: 0  Mixed hyperlipidemia  Prediabetes Assessment & Plan: Pt advised of the following: Work on a diabetic diet, try to incorporate exercise at least 20-30 a day for 3 days a week or more.    Orders: -     Wegovy; After four weeks of 0.25 mg qweek, increase to 0.5 mg qweek  Dispense: 2 mL; Refill: 2 -     Wegovy; Start 0.25 mg qweek for four weeks, then increase to 0.5 mg dose qweek  Dispense: 2 mL; Refill: 0  At risk for diabetes mellitus -     MJ.Hock; After four weeks of 0.25 mg qweek, increase to 0.5 mg qweek  Dispense: 2 mL; Refill: 2 -     Wegovy; Start 0.25 mg qweek for four weeks, then increase to 0.5 mg dose qweek  Dispense: 2 mL; Refill: 0  OSA (obstructive sleep apnea) Assessment & Plan: Compliant with cpap       Return in about 3 weeks (around 07/17/2023) for f/u blood pressure.  Mort Sawyers, MSN, APRN, FNP-C [facility]

## 2023-06-28 MED ORDER — WEGOVY 0.25 MG/0.5ML ~~LOC~~ SOAJ
SUBCUTANEOUS | 0 refills | Status: AC
Start: 2023-06-28 — End: ?

## 2023-06-28 MED ORDER — WEGOVY 0.5 MG/0.5ML ~~LOC~~ SOAJ
SUBCUTANEOUS | 2 refills | Status: AC
Start: 2023-06-28 — End: ?

## 2023-06-28 NOTE — Addendum Note (Signed)
Addended by: Donnamarie Poag on: 06/28/2023 08:49 AM   Modules accepted: Orders

## 2023-06-28 NOTE — Telephone Encounter (Signed)
Ok to send as pended.

## 2023-07-03 MED ORDER — WEGOVY 0.5 MG/0.5ML ~~LOC~~ SOAJ: SUBCUTANEOUS | 2 refills | Status: DC

## 2023-07-03 MED ORDER — WEGOVY 0.25 MG/0.5ML ~~LOC~~ SOAJ
SUBCUTANEOUS | 0 refills | Status: DC
Start: 2023-07-03 — End: 2023-07-17

## 2023-07-03 NOTE — Addendum Note (Signed)
Addended by: Mort Sawyers on: 07/03/2023 03:14 PM   Modules accepted: Orders

## 2023-07-11 ENCOUNTER — Other Ambulatory Visit: Payer: Self-pay | Admitting: Family

## 2023-07-11 DIAGNOSIS — I10 Essential (primary) hypertension: Secondary | ICD-10-CM

## 2023-07-17 ENCOUNTER — Encounter: Payer: Self-pay | Admitting: Family

## 2023-07-17 ENCOUNTER — Ambulatory Visit: Payer: 59 | Admitting: Family

## 2023-07-17 VITALS — BP 126/86 | HR 66 | Temp 97.9°F | Ht 67.0 in | Wt 279.0 lb

## 2023-07-17 DIAGNOSIS — R600 Localized edema: Secondary | ICD-10-CM | POA: Diagnosis not present

## 2023-07-17 DIAGNOSIS — F411 Generalized anxiety disorder: Secondary | ICD-10-CM | POA: Diagnosis not present

## 2023-07-17 DIAGNOSIS — I10 Essential (primary) hypertension: Secondary | ICD-10-CM

## 2023-07-17 MED ORDER — HYDROCHLOROTHIAZIDE 25 MG PO TABS
ORAL_TABLET | ORAL | 3 refills | Status: DC
Start: 2023-07-17 — End: 2023-07-24

## 2023-07-17 MED ORDER — LISINOPRIL 40 MG PO TABS
40.0000 mg | ORAL_TABLET | Freq: Every day | ORAL | 3 refills | Status: DC
Start: 2023-07-17 — End: 2024-03-31

## 2023-07-17 NOTE — Progress Notes (Signed)
Established Patient Office Visit  Subjective:      CC:  Chief Complaint  Patient presents with   Hypertension    HPI: Jill Cruz is a 57 y.o. female presenting on 07/17/2023 for Hypertension . HTN: last visit increased amlodipine to 10 mg once daily and advised to continue lisinopril 20 mg. Today in office 126/86. Average at home around 135/80. She does state that she has noticed more pedal edema, however she states worse   GAD: last visit started on sertraline 50 mg once daily. She states that she is really liking this, and not as easily agitated.   Obesity: started rx on wegovy however she states that was unable to get it covered. She is following weight watchers at this time, and she signed up for their weight loss clinic and she is trying to obtain a GLP 1 this way as her insurance does not cover this.   Exercise: active around the house, no active heart burning exercise however. Going to work on this once she gets her diet under control.   Lab Results  Component Value Date   HGBA1C 5.7 07/06/2021        Wt Readings from Last 3 Encounters:  07/17/23 279 lb (126.6 kg)  06/26/23 281 lb 3.2 oz (127.6 kg)  03/20/23 273 lb (123.8 kg)   Temp Readings from Last 3 Encounters:  07/17/23 97.9 F (36.6 C) (Oral)  06/26/23 97.8 F (36.6 C) (Temporal)  03/20/23 97.6 F (36.4 C) (Temporal)   BP Readings from Last 3 Encounters:  07/17/23 126/86  06/26/23 (!) 160/100  03/20/23 134/76   Pulse Readings from Last 3 Encounters:  07/17/23 66  06/26/23 78  03/20/23 74      Social history:  Relevant past medical, surgical, family and social history reviewed and updated as indicated. Interim medical history since our last visit reviewed.  Allergies and medications reviewed and updated.  DATA REVIEWED: CHART IN EPIC     ROS: Negative unless specifically indicated above in HPI.    Current Outpatient Medications:    lisinopril (ZESTRIL) 40 MG tablet,  Take 1 tablet (40 mg total) by mouth daily., Disp: 90 tablet, Rfl: 3   sertraline (ZOLOFT) 50 MG tablet, Take 1 tablet (50 mg total) by mouth daily., Disp: 90 tablet, Rfl: 0   amLODipine (NORVASC) 10 MG tablet, Take 1 tablet (10 mg total) by mouth daily., Disp: 90 tablet, Rfl: 0   folic acid (FOLVITE) 1 MG tablet, Take 1 mg by mouth daily., Disp: , Rfl:    hydrochlorothiazide (HYDRODIURIL) 25 MG tablet, Take one po every day, Disp: 90 tablet, Rfl: 3   meloxicam (MOBIC) 15 MG tablet, Take 15 mg by mouth daily., Disp: , Rfl:    nystatin-triamcinolone (MYCOLOG II) cream, Apply 1 application topically as needed. , Disp: , Rfl:       Objective:    BP 126/86 (BP Location: Left Arm, Patient Position: Sitting, Cuff Size: Large)   Pulse 66   Temp 97.9 F (36.6 C) (Oral)   Ht 5\' 7"  (1.702 m)   Wt 279 lb (126.6 kg)   LMP 08/02/2014   SpO2 97%   BMI 43.70 kg/m   Wt Readings from Last 3 Encounters:  07/17/23 279 lb (126.6 kg)  06/26/23 281 lb 3.2 oz (127.6 kg)  03/20/23 273 lb (123.8 kg)    Physical Exam Constitutional:      General: She is not in acute distress.    Appearance: Normal appearance. She  is obese. She is not ill-appearing, toxic-appearing or diaphoretic.  HENT:     Head: Normocephalic.  Cardiovascular:     Rate and Rhythm: Normal rate and regular rhythm.  Pulmonary:     Effort: Pulmonary effort is normal.  Musculoskeletal:        General: Normal range of motion.  Neurological:     General: No focal deficit present.     Mental Status: She is alert and oriented to person, place, and time. Mental status is at baseline.  Psychiatric:        Mood and Affect: Mood normal.        Behavior: Behavior normal.        Thought Content: Thought content normal.        Judgment: Judgment normal.           Assessment & Plan:  Essential hypertension Assessment & Plan: Improved not quite at goal <130/90  Start taking hydrochlorothiazide 25 mg once daily  Continue lisinopril  20 mg once daily and amlodipine 10 mg once daily Wear compression socks and watch a low sodium diet.   Orders: -     Lisinopril; Take 1 tablet (40 mg total) by mouth daily.  Dispense: 90 tablet; Refill: 3  Pedal edema -     hydroCHLOROthiazide; Take one po every day  Dispense: 90 tablet; Refill: 3  GAD (generalized anxiety disorder) Assessment & Plan: Improving with sertraline.  Continue sertraline 50 mg once daily.     Morbid obesity (HCC) Assessment & Plan: Continue to work on diet and exercise.        Return in about 6 months (around 01/15/2024) for f/u CPE.  Mort Sawyers, MSN, APRN, FNP-C [facility]

## 2023-07-17 NOTE — Assessment & Plan Note (Signed)
Improving with sertraline.  Continue sertraline 50 mg once daily.

## 2023-07-17 NOTE — Assessment & Plan Note (Signed)
Improved not quite at goal <130/90  Start taking hydrochlorothiazide 25 mg once daily  Continue lisinopril 20 mg once daily and amlodipine 10 mg once daily Wear compression socks and watch a low sodium diet.

## 2023-07-17 NOTE — Assessment & Plan Note (Signed)
Continue to work on diet and exercise

## 2023-07-18 ENCOUNTER — Encounter: Payer: Self-pay | Admitting: Family

## 2023-07-24 ENCOUNTER — Other Ambulatory Visit: Payer: Self-pay | Admitting: Family

## 2023-07-24 DIAGNOSIS — R600 Localized edema: Secondary | ICD-10-CM

## 2023-07-31 ENCOUNTER — Encounter: Payer: Self-pay | Admitting: Family

## 2023-09-19 ENCOUNTER — Other Ambulatory Visit: Payer: Self-pay | Admitting: Family

## 2023-09-19 DIAGNOSIS — F411 Generalized anxiety disorder: Secondary | ICD-10-CM

## 2023-09-19 DIAGNOSIS — I10 Essential (primary) hypertension: Secondary | ICD-10-CM

## 2023-09-20 ENCOUNTER — Other Ambulatory Visit: Payer: Self-pay | Admitting: Family

## 2023-09-20 DIAGNOSIS — I10 Essential (primary) hypertension: Secondary | ICD-10-CM

## 2023-09-20 MED ORDER — SERTRALINE HCL 50 MG PO TABS: 50.0000 mg | ORAL_TABLET | Freq: Every day | ORAL | 3 refills | Status: DC

## 2023-09-20 MED ORDER — FOLIC ACID 1 MG PO TABS: 1.0000 mg | ORAL_TABLET | Freq: Every day | ORAL | 0 refills | Status: AC

## 2023-12-11 ENCOUNTER — Other Ambulatory Visit (HOSPITAL_COMMUNITY): Payer: Self-pay

## 2023-12-16 ENCOUNTER — Other Ambulatory Visit: Payer: Self-pay | Admitting: Family

## 2023-12-16 DIAGNOSIS — I10 Essential (primary) hypertension: Secondary | ICD-10-CM

## 2023-12-17 ENCOUNTER — Other Ambulatory Visit: Payer: Self-pay | Admitting: Family

## 2023-12-17 DIAGNOSIS — I10 Essential (primary) hypertension: Secondary | ICD-10-CM

## 2024-02-13 ENCOUNTER — Telehealth: Payer: Self-pay

## 2024-02-13 ENCOUNTER — Other Ambulatory Visit (HOSPITAL_COMMUNITY): Payer: Self-pay

## 2024-02-13 NOTE — Telephone Encounter (Signed)
 Pharmacy Patient Advocate Encounter   Received notification from Patient Pharmacy that prior authorization for Wegovy  0.25 is required/requested.   Insurance verification completed.   The patient is insured through [facility] .   Per test claim: patient has a plan benefit exclusion

## 2024-02-22 ENCOUNTER — Other Ambulatory Visit (HOSPITAL_COMMUNITY): Payer: Self-pay

## 2024-02-22 ENCOUNTER — Telehealth: Payer: Self-pay

## 2024-02-22 NOTE — Telephone Encounter (Signed)
 Pharmacy Patient Advocate Encounter  Received notification from [facility] that Prior Authorization for Wegovy  0.25MG /0.5ML auto-injectors  has been CANCELLED due to PLAN EXCLUSION   PA #/Case ID/Reference #: [id number]     Not a covered benefit,Plan Exclusion

## 2024-02-22 NOTE — Telephone Encounter (Signed)
 Pharmacy Patient Advocate Encounter   Received notification from Pt Calls Messages that prior authorization for Wegovy  0.25MG /0.5ML auto-injectors is required/requested.   Insurance verification completed.   The patient is insured through [facility] .   Per test claim: PA required; PA started via CoverMyMeds. KEY [id number] . Waiting for clinical questions to populate.

## 2024-02-22 NOTE — Telephone Encounter (Signed)
Can we start prior auth for wegovy?

## 2024-02-22 NOTE — Telephone Encounter (Signed)
 PA request has been Started. New Encounter has been or will be created for follow up. For additional info see Pharmacy Prior Auth telephone encounter from 02/22/24.

## 2024-03-04 ENCOUNTER — Telehealth: Payer: Self-pay

## 2024-03-04 ENCOUNTER — Other Ambulatory Visit (HOSPITAL_COMMUNITY): Payer: Self-pay

## 2024-03-06 NOTE — Telephone Encounter (Signed)
 A user error has taken place: encounter opened in error, closed for administrative reasons.

## 2024-03-16 ENCOUNTER — Other Ambulatory Visit: Payer: Self-pay | Admitting: Family

## 2024-03-16 DIAGNOSIS — I10 Essential (primary) hypertension: Secondary | ICD-10-CM

## 2024-03-31 ENCOUNTER — Other Ambulatory Visit: Payer: Self-pay | Admitting: Family

## 2024-03-31 ENCOUNTER — Telehealth: Payer: Self-pay

## 2024-03-31 ENCOUNTER — Ambulatory Visit (INDEPENDENT_AMBULATORY_CARE_PROVIDER_SITE_OTHER): Admitting: Family

## 2024-03-31 ENCOUNTER — Telehealth: Payer: Self-pay | Admitting: Family

## 2024-03-31 ENCOUNTER — Encounter: Payer: Self-pay | Admitting: Family

## 2024-03-31 ENCOUNTER — Other Ambulatory Visit (HOSPITAL_COMMUNITY): Payer: Self-pay

## 2024-03-31 VITALS — BP 122/80 | HR 74 | Temp 98.1°F | Ht 67.0 in | Wt 250.0 lb

## 2024-03-31 DIAGNOSIS — I10 Essential (primary) hypertension: Secondary | ICD-10-CM

## 2024-03-31 DIAGNOSIS — G4733 Obstructive sleep apnea (adult) (pediatric): Secondary | ICD-10-CM

## 2024-03-31 DIAGNOSIS — F411 Generalized anxiety disorder: Secondary | ICD-10-CM

## 2024-03-31 DIAGNOSIS — E782 Mixed hyperlipidemia: Secondary | ICD-10-CM

## 2024-03-31 DIAGNOSIS — R7303 Prediabetes: Secondary | ICD-10-CM | POA: Diagnosis not present

## 2024-03-31 MED ORDER — WEGOVY 1 MG/0.5ML ~~LOC~~ SOAJ
1.0000 mg | SUBCUTANEOUS | 0 refills | Status: DC
Start: 2024-03-31 — End: 2024-04-07

## 2024-03-31 NOTE — Telephone Encounter (Signed)
 In media note 06/26/23 from [facility], was documented for CPAP f/u if that is helpful.

## 2024-03-31 NOTE — Assessment & Plan Note (Signed)
 Increase 1 mg weekly semaglutide  Tolerating well.  Pt advised to work on diet and exercise as tolerated Encouraged exercise

## 2024-03-31 NOTE — Telephone Encounter (Signed)
 Can we submit wegovy  1 mg weekly for prior auth Pt with sleep apnea maybe this will get approved with this indication

## 2024-03-31 NOTE — Assessment & Plan Note (Signed)
 Will try to obtain approval of wegovy  due to OSA indications

## 2024-03-31 NOTE — Progress Notes (Signed)
 Established Patient Office Visit  Subjective:      CC:  Chief Complaint  Patient presents with   Medical Management of Chronic Issues    Wt loss     HPI: Jill Cruz is a 58 y.o. female presenting on 03/31/2024 for Medical Management of Chronic Issues (Wt loss ) .   Obesity: has lost 31 pounds since 06/2023. She had signed up through weight watchers through compound semaglutide . She states the 0.5 mg wegovy  has not been helping her very much over these last four months. Is active running around with her kids but she otherwise does not have an established exercise routine.   Wt Readings from Last 3 Encounters:  03/31/24 250 lb (113.4 kg)  07/17/23 279 lb (126.6 kg)  06/26/23 281 lb 3.2 oz (127.6 kg)       Social history:  Relevant past medical, surgical, family and social history reviewed and updated as indicated. Interim medical history since our last visit reviewed.  Allergies and medications reviewed and updated.  DATA REVIEWED: CHART IN EPIC     ROS: Negative unless specifically indicated above in HPI.    Current Outpatient Medications:    amLODipine  (NORVASC ) 10 MG tablet, Take 1 tablet (10 mg total) by mouth daily. MUST HAVE OV FOR FURTHER REFILLS, Disp: 90 tablet, Rfl: 0   folic acid  (FOLVITE ) 1 MG tablet, Take 1 tablet (1 mg total) by mouth daily., Disp: 90 tablet, Rfl: 0   hydrochlorothiazide  (HYDRODIURIL ) 25 MG tablet, TAKE 1/2 TABLET ONCE DAILY AS NEEDED PEDAL EDEMA, Disp: 45 tablet, Rfl: 1   lisinopril  (ZESTRIL ) 20 MG tablet, Take 1 tablet (20 mg total) by mouth daily. MUST HAVE OV FOR FURTHER REFILLS, Disp: 90 tablet, Rfl: 0   meloxicam (MOBIC) 15 MG tablet, Take 15 mg by mouth daily., Disp: , Rfl:    nystatin-triamcinolone (MYCOLOG II) cream, Apply 1 application topically as needed. , Disp: , Rfl:    Semaglutide -Weight Management (WEGOVY ) 1 MG/0.5ML SOAJ, Inject 1 mg into the skin once a week., Disp: 2 mL, Rfl: 0   sertraline  (ZOLOFT ) 50 MG  tablet, Take 1 tablet (50 mg total) by mouth daily., Disp: 90 tablet, Rfl: 3      Objective:    BP 122/80   Pulse 74   Temp 98.1 F (36.7 C) (Oral)   Ht 5' 7 (1.702 m)   Wt 250 lb (113.4 kg)   LMP 08/02/2014   SpO2 96%   BMI 39.16 kg/m   Wt Readings from Last 3 Encounters:  03/31/24 250 lb (113.4 kg)  07/17/23 279 lb (126.6 kg)  06/26/23 281 lb 3.2 oz (127.6 kg)    Physical Exam Constitutional:      General: She is not in acute distress.    Appearance: Normal appearance. She is obese. She is not ill-appearing, toxic-appearing or diaphoretic.  HENT:     Head: Normocephalic.   Cardiovascular:     Rate and Rhythm: Normal rate and regular rhythm.  Pulmonary:     Effort: Pulmonary effort is normal.   Musculoskeletal:        General: Normal range of motion.   Neurological:     General: No focal deficit present.     Mental Status: She is alert and oriented to person, place, and time. Mental status is at baseline.   Psychiatric:        Mood and Affect: Mood normal.        Behavior: Behavior normal.  Thought Content: Thought content normal.        Judgment: Judgment normal.           Assessment & Plan:  Essential hypertension Assessment & Plan:  Continue taking hydrochlorothiazide  25 mg once daily  Continue lisinopril  20 mg once daily and amlodipine  10 mg once daily Wear compression socks and watch a low sodium diet.   Orders: -     Wegovy ; Inject 1 mg into the skin once a week.  Dispense: 2 mL; Refill: 0  OSA (obstructive sleep apnea) Assessment & Plan: Will try to obtain approval of wegovy  due to OSA indications  Orders: -     Wegovy ; Inject 1 mg into the skin once a week.  Dispense: 2 mL; Refill: 0  Mixed hyperlipidemia -     Wegovy ; Inject 1 mg into the skin once a week.  Dispense: 2 mL; Refill: 0  Prediabetes -     Wegovy ; Inject 1 mg into the skin once a week.  Dispense: 2 mL; Refill: 0  Morbid obesity (HCC) Assessment &  Plan: Increase 1 mg weekly semaglutide  Tolerating well.  Pt advised to work on diet and exercise as tolerated Encouraged exercise  Orders: -     Wegovy ; Inject 1 mg into the skin once a week.  Dispense: 2 mL; Refill: 0  GAD (generalized anxiety disorder) Assessment & Plan: stable      Return in about 6 months (around 09/30/2024) for f/u CPE.  Ginger Patrick, MSN, APRN, FNP-C [facility]

## 2024-03-31 NOTE — Assessment & Plan Note (Signed)
 stable

## 2024-03-31 NOTE — Telephone Encounter (Signed)
 Pharmacy Patient Advocate Encounter   Received notification from Physician's Office that prior authorization for Wegovy  0.25 is required/requested.   Insurance verification completed.   The patient is insured through [facility] .   Per test claim: PA required; PA submitted to above mentioned insurance via CoverMyMeds Key/confirmation #/EOC [id number] Status is pending

## 2024-03-31 NOTE — Assessment & Plan Note (Signed)
  Continue taking hydrochlorothiazide  25 mg once daily  Continue lisinopril  20 mg once daily and amlodipine  10 mg once daily Wear compression socks and watch a low sodium diet.

## 2024-04-01 ENCOUNTER — Other Ambulatory Visit (HOSPITAL_COMMUNITY): Payer: Self-pay

## 2024-04-02 NOTE — Telephone Encounter (Signed)
 Do we know why wegovy  prior auth was submitted at 0.25? She is on a compound at 1 mg right now so I had sent in 1 mg RX not 0.25. Or does insurance require a step down titrate up

## 2024-04-03 ENCOUNTER — Other Ambulatory Visit (HOSPITAL_COMMUNITY): Payer: Self-pay

## 2024-04-03 NOTE — Telephone Encounter (Signed)
 CMM states they are not the benefit manager for patients Prior authorization. Submitted to rx.benefits EOC # [id number]  Est time of completion 04/09/24

## 2024-04-07 NOTE — Telephone Encounter (Signed)
 What exactly does this mean?  Or is it still in progress?

## 2024-04-08 NOTE — Telephone Encounter (Signed)
 Pharmacy Patient Advocate Encounter  Received notification from [facility] that Prior Authorization for Wegovy  has been DENIED.  Full denial letter will be uploaded to the media tab. See denial reason below.   PA #/Case ID/Reference #: [id number]

## 2024-04-09 ENCOUNTER — Other Ambulatory Visit: Payer: Self-pay | Admitting: Family

## 2024-04-09 DIAGNOSIS — R600 Localized edema: Secondary | ICD-10-CM

## 2024-04-10 MED ORDER — WEGOVY 1 MG/0.5ML ~~LOC~~ SOAJ: 1.0000 mg | SUBCUTANEOUS | 0 refills | Status: DC

## 2024-04-10 NOTE — Addendum Note (Signed)
 Addended by: CORWIN ANTU on: 04/10/2024 02:18 PM   Modules accepted: Orders

## 2024-04-28 ENCOUNTER — Other Ambulatory Visit: Payer: Self-pay | Admitting: Medical Genetics

## 2024-04-30 ENCOUNTER — Telehealth: Payer: Self-pay

## 2024-04-30 ENCOUNTER — Other Ambulatory Visit (HOSPITAL_COMMUNITY): Payer: Self-pay

## 2024-04-30 NOTE — Telephone Encounter (Signed)
 Pharmacy Patient Advocate Encounter   Received notification from CoverMyMeds that prior authorization for Wegovy  1MG /0.5ML auto-injectors  is required/requested.   Insurance verification completed.   The patient is insured through [facility] .   Per test claim: Product/service not covered, not a covered benefit

## 2024-05-01 NOTE — Telephone Encounter (Signed)
 Does this mean prior auth was submitted and it was not covered because its a plan exclusion or do I still need to submit a prior auth?

## 2024-05-01 NOTE — Telephone Encounter (Signed)
 If we get a reject saying product not covered, product not on formulary or plan exclusion we do not typically do a PA for it because it will most likely get denied. I can attempt it if you would like me to, Please let me know how you want to proceed.

## 2024-06-15 ENCOUNTER — Other Ambulatory Visit: Payer: Self-pay | Admitting: Family

## 2024-06-15 DIAGNOSIS — I10 Essential (primary) hypertension: Secondary | ICD-10-CM

## 2024-07-30 ENCOUNTER — Other Ambulatory Visit: Payer: Self-pay | Admitting: Medical Genetics

## 2024-07-30 DIAGNOSIS — Z006 Encounter for examination for normal comparison and control in clinical research program: Secondary | ICD-10-CM

## 2024-08-25 ENCOUNTER — Telehealth: Payer: Self-pay | Admitting: Medical Genetics

## 2024-08-25 DIAGNOSIS — Z006 Encounter for examination for normal comparison and control in clinical research program: Secondary | ICD-10-CM

## 2024-08-25 LAB — GENECONNECT MOLECULAR SCREEN

## 2024-08-26 NOTE — Telephone Encounter (Signed)
 [facility]  08/26/2024 3:42 PM  Confirmed I was speaking with Jill Cruz [phone] by using name and DOB. Informed participant the reason for this call is to follow-up on a recent sample the participant provided at one of the [facility] lab locations. Informed participant the test was not able to be completed with this sample and apologized for the inconvenience. Participant was requested to provide a new sample at one of our participating labs at no cost so that participant can continue participation and receive test results. Informed participant they do not need to be fasting and if there are other samples that need to be drawn, they can be done at the same visit. Participant has not had a blood transfusion or blood product in the last 30 days. Participant agreed to provide another sample. Participant was provided the [name] program website to learn why this may have happened. Participant was thanked for their time and continued support of the above study.    Jordyn Pennstrom, BS [place]  [facility] Clinical Research Specialist II Direct Dial: [phone]  Fax: [phone]

## 2024-09-13 ENCOUNTER — Other Ambulatory Visit: Payer: Self-pay | Admitting: Family

## 2024-09-13 DIAGNOSIS — I10 Essential (primary) hypertension: Secondary | ICD-10-CM

## 2024-09-15 ENCOUNTER — Other Ambulatory Visit: Payer: Self-pay | Admitting: Family

## 2024-09-15 DIAGNOSIS — F411 Generalized anxiety disorder: Secondary | ICD-10-CM

## 2024-10-01 ENCOUNTER — Other Ambulatory Visit: Payer: Self-pay | Admitting: Family

## 2024-10-01 DIAGNOSIS — R600 Localized edema: Secondary | ICD-10-CM

## 2024-10-08 ENCOUNTER — Ambulatory Visit: Admitting: Family

## 2024-10-08 VITALS — BP 124/84 | HR 75 | Temp 98.0°F | Ht 67.0 in | Wt 279.2 lb

## 2024-10-08 DIAGNOSIS — E559 Vitamin D deficiency, unspecified: Secondary | ICD-10-CM | POA: Diagnosis not present

## 2024-10-08 DIAGNOSIS — R635 Abnormal weight gain: Secondary | ICD-10-CM

## 2024-10-08 DIAGNOSIS — F411 Generalized anxiety disorder: Secondary | ICD-10-CM | POA: Diagnosis not present

## 2024-10-08 DIAGNOSIS — E782 Mixed hyperlipidemia: Secondary | ICD-10-CM | POA: Diagnosis not present

## 2024-10-08 DIAGNOSIS — R7303 Prediabetes: Secondary | ICD-10-CM | POA: Diagnosis not present

## 2024-10-08 DIAGNOSIS — Z6841 Body Mass Index (BMI) 40.0 and over, adult: Secondary | ICD-10-CM | POA: Diagnosis not present

## 2024-10-08 DIAGNOSIS — G4733 Obstructive sleep apnea (adult) (pediatric): Secondary | ICD-10-CM

## 2024-10-08 DIAGNOSIS — I10 Essential (primary) hypertension: Secondary | ICD-10-CM | POA: Diagnosis not present

## 2024-10-08 LAB — BASIC METABOLIC PANEL WITH GFR
BUN: 15 mg/dL (ref 6–23)
CO2: 30 meq/L (ref 19–32)
Calcium: 9.5 mg/dL (ref 8.4–10.5)
Chloride: 98 meq/L (ref 96–112)
Creatinine, Ser: 0.75 mg/dL (ref 0.40–1.20)
GFR: 87.94 mL/min
Glucose, Bld: 91 mg/dL (ref 70–99)
Potassium: 4.3 meq/L (ref 3.5–5.1)
Sodium: 133 meq/L — ABNORMAL LOW (ref 135–145)

## 2024-10-08 LAB — HEMOGLOBIN A1C: Hgb A1c MFr Bld: 5.6 % (ref 4.6–6.5)

## 2024-10-08 LAB — VITAMIN D 25 HYDROXY (VIT D DEFICIENCY, FRACTURES): VITD: 11.59 ng/mL — ABNORMAL LOW (ref 30.00–100.00)

## 2024-10-08 LAB — TSH: TSH: 1.1 u[IU]/mL (ref 0.35–5.50)

## 2024-10-08 MED ORDER — SERTRALINE HCL 100 MG PO TABS: 100.0000 mg | ORAL_TABLET | Freq: Every day | ORAL | 1 refills | Status: AC

## 2024-10-08 NOTE — Progress Notes (Signed)
 "  Established Patient Office Visit  Subjective:      CC:  Chief Complaint  Patient presents with   Medical Management of Chronic Issues    6 month follow up    HPI: Jill Cruz is a 59 y.o. female presenting on 10/08/2024 for Medical Management of Chronic Issues (6 month follow up) .  Discussed the use of AI scribe software for clinical note transcription with the patient, who gave verbal consent to proceed.  History of Present Illness Jill Cruz is a 59 year old female who presents for a follow-up visit after regaining weight previously lost on Wegovy .  She has regained most of the 31 pounds she initially lost since September 2024 through Weight [employer] and the use of Wegovy . The weight regain occurred after discontinuing Wegovy  due to lack of insurance coverage. She notes a significant loss of muscle mass, leading to persistent soreness and increased knee pain, particularly when using stairs. She attributes this to not engaging in muscle-building activities while on Wegovy .  She is starting an anti-inflammatory program at [facility], which includes weekly Zoom calls and a focus on strength training. She believes having someone to track her progress will be beneficial.  She experiences anxiety, particularly at night when trying to sleep, describing it as 'butterflies in your stomach.' This has been occurring for about a month, which she attributes to holiday stress and frustration over her weight gain.  She has a history of sleep apnea and uses a CPAP machine regularly. She is not currently taking vitamin D  supplements.     Wt Readings from Last 3 Encounters:  10/08/24 279 lb 3.2 oz (126.6 kg)  03/31/24 250 lb (113.4 kg)  07/17/23 279 lb (126.6 kg)            Social history:  Relevant past medical, surgical, family and social history reviewed and updated as indicated. Interim medical history since our last visit reviewed.  Allergies  and medications reviewed and updated.  DATA REVIEWED: CHART IN EPIC     ROS: Negative unless specifically indicated above in HPI.   Current Medications[1]        Objective:        BP 124/84 (BP Location: Left Arm, Patient Position: Sitting, Cuff Size: Large)   Pulse 75   Temp 98 F (36.7 C) (Temporal)   Ht 5' 7 (1.702 m)   Wt 279 lb 3.2 oz (126.6 kg)   LMP 08/02/2014   SpO2 98%   BMI 43.73 kg/m   Physical Exam NECK: Thyroid  normal. CARDIOVASCULAR: Heart sounds normal.  Wt Readings from Last 3 Encounters:  10/08/24 279 lb 3.2 oz (126.6 kg)  03/31/24 250 lb (113.4 kg)  07/17/23 279 lb (126.6 kg)    Physical Exam Vitals reviewed.  Constitutional:      General: She is not in acute distress.    Appearance: Normal appearance. She is obese. She is not ill-appearing, toxic-appearing or diaphoretic.  HENT:     Head: Normocephalic.  Cardiovascular:     Rate and Rhythm: Normal rate and regular rhythm.  Pulmonary:     Effort: Pulmonary effort is normal.  Musculoskeletal:        General: Normal range of motion.  Neurological:     General: No focal deficit present.     Mental Status: She is alert and oriented to person, place, and time. Mental status is at baseline.  Psychiatric:        Mood and Affect:  Mood normal.        Behavior: Behavior normal.        Thought Content: Thought content normal.        Judgment: Judgment normal.          Results   Assessment & Plan:   Assessment and Plan Assessment & Plan Morbid obesity Weight gain after discontinuation of Wegovy  due to insurance issues. Muscle loss likely due to lack of strength training while on GLP-1 agonist. Increased knee pain likely due to patellofemoral shifting from muscle loss. Considering reinitiating Wegovy  due to its previous effectiveness and new self-pay pricing options. - Will consider reinitiating Wegovy  if she decides to proceed. - Encouraged participation in the anti-inflammatory  program starting this Friday, including strength training. - Discussed self-pay pricing options for Wegovy , including pills and injections.  Generalized anxiety disorder Increased anxiety symptoms over the past month, possibly related to weight gain and holiday stress. Current sertraline  dose may be insufficient. - Increased sertraline  to 100 mg daily to address anxiety symptoms and potentially improve sleep.  Obstructive sleep apnea Continues to use CPAP for management. Previous attempts to obtain Wegovy  coverage due to sleep apnea were unsuccessful.  Vitamin D  deficiency Not currently taking vitamin D  supplements. Chronic deficiency likely due to insufficient intake. - Recommended chronic vitamin D  supplementation at 2000 IU daily.  General health maintenance No recent lab work since June 2024. Labs needed to assess current health status. - Ordered labs including thyroid  function, kidney function, and A1c.        Return in about 6 months (around 04/07/2025) for f/u CPE.     Ginger Patrick, MSN, APRN, FNP-C [facility]        [1]  Current Outpatient Medications:    amLODipine  (NORVASC ) 10 MG tablet, TAKE 1 TABLET (10 MG TOTAL) BY MOUTH DAILY. MUST HAVE OV FOR FURTHER REFILLS, Disp: 90 tablet, Rfl: 0   folic acid  (FOLVITE ) 1 MG tablet, Take 1 tablet (1 mg total) by mouth daily., Disp: 90 tablet, Rfl: 0   hydrochlorothiazide  (HYDRODIURIL ) 25 MG tablet, TAKE 1/2 TABLET ONCE DAILY AS NEEDED PEDAL EDEMA, Disp: 45 tablet, Rfl: 1   lisinopril  (ZESTRIL ) 20 MG tablet, TAKE 1 TABLET (20 MG TOTAL) BY MOUTH DAILY. MUST HAVE OV FOR FURTHER REFILLS, Disp: 90 tablet, Rfl: 0   meloxicam (MOBIC) 15 MG tablet, Take 15 mg by mouth daily., Disp: , Rfl:    nystatin-triamcinolone (MYCOLOG II) cream, Apply 1 application topically as needed. , Disp: , Rfl:    sertraline  (ZOLOFT ) 100 MG tablet, Take 1 tablet (100 mg total) by mouth daily., Disp: 90 tablet, Rfl: 1  "

## 2024-10-13 ENCOUNTER — Ambulatory Visit: Payer: Self-pay | Admitting: Family

## 2024-10-13 DIAGNOSIS — I10 Essential (primary) hypertension: Secondary | ICD-10-CM

## 2024-10-14 MED ORDER — AMLODIPINE BESYLATE 10 MG PO TABS: 10.0000 mg | ORAL_TABLET | Freq: Every day | ORAL | 3 refills | Status: AC

## 2024-10-21 MED ORDER — LISINOPRIL 20 MG PO TABS: 20.0000 mg | ORAL_TABLET | Freq: Every day | ORAL | 1 refills | Status: AC

## 2024-10-21 NOTE — Addendum Note (Signed)
 Addended by: ALBINO SHAVER C on: 10/21/2024 11:35 AM   Modules accepted: Orders
